# Patient Record
Sex: Female | Born: 1970 | Race: White | Hispanic: No | Marital: Married | Smoking: Never smoker
Health system: Southern US, Community
[De-identification: ages and names within clinical notes are randomized; demographics above are authoritative.]

## PROBLEM LIST (undated history)

## (undated) DIAGNOSIS — F410 Panic disorder [episodic paroxysmal anxiety] without agoraphobia: Secondary | ICD-10-CM

## (undated) DIAGNOSIS — F419 Anxiety disorder, unspecified: Secondary | ICD-10-CM

## (undated) DIAGNOSIS — Z8489 Family history of other specified conditions: Secondary | ICD-10-CM

## (undated) DIAGNOSIS — F329 Major depressive disorder, single episode, unspecified: Secondary | ICD-10-CM

## (undated) DIAGNOSIS — E039 Hypothyroidism, unspecified: Secondary | ICD-10-CM

## (undated) DIAGNOSIS — K219 Gastro-esophageal reflux disease without esophagitis: Secondary | ICD-10-CM

## (undated) DIAGNOSIS — F32A Depression, unspecified: Secondary | ICD-10-CM

## (undated) DIAGNOSIS — R0600 Dyspnea, unspecified: Secondary | ICD-10-CM

## (undated) DIAGNOSIS — M549 Dorsalgia, unspecified: Secondary | ICD-10-CM

## (undated) HISTORY — PX: EYE SURGERY: SHX253

## (undated) HISTORY — PX: ABDOMINAL HYSTERECTOMY: SHX81

---

## 1997-07-14 ENCOUNTER — Inpatient Hospital Stay (HOSPITAL_COMMUNITY): Admission: AD | Admit: 1997-07-14 | Discharge: 1997-07-14 | Payer: Self-pay | Admitting: Obstetrics and Gynecology

## 1997-11-15 ENCOUNTER — Ambulatory Visit (HOSPITAL_COMMUNITY): Admission: RE | Admit: 1997-11-15 | Discharge: 1997-11-15 | Payer: Self-pay | Admitting: Obstetrics and Gynecology

## 1997-12-21 ENCOUNTER — Observation Stay (HOSPITAL_COMMUNITY): Admission: AD | Admit: 1997-12-21 | Discharge: 1997-12-21 | Payer: Self-pay | Admitting: Obstetrics and Gynecology

## 1998-01-12 ENCOUNTER — Inpatient Hospital Stay (HOSPITAL_COMMUNITY): Admission: AD | Admit: 1998-01-12 | Discharge: 1998-01-12 | Payer: Self-pay | Admitting: Obstetrics and Gynecology

## 1998-02-01 ENCOUNTER — Inpatient Hospital Stay (HOSPITAL_COMMUNITY): Admission: AD | Admit: 1998-02-01 | Discharge: 1998-02-03 | Payer: Self-pay | Admitting: Obstetrics and Gynecology

## 1998-06-30 ENCOUNTER — Other Ambulatory Visit: Admission: RE | Admit: 1998-06-30 | Discharge: 1998-06-30 | Payer: Self-pay | Admitting: Obstetrics and Gynecology

## 1999-06-12 ENCOUNTER — Other Ambulatory Visit: Admission: RE | Admit: 1999-06-12 | Discharge: 1999-06-12 | Payer: Self-pay | Admitting: Obstetrics and Gynecology

## 2000-05-23 ENCOUNTER — Other Ambulatory Visit: Admission: RE | Admit: 2000-05-23 | Discharge: 2000-05-23 | Payer: Self-pay | Admitting: Obstetrics and Gynecology

## 2001-05-26 ENCOUNTER — Other Ambulatory Visit: Admission: RE | Admit: 2001-05-26 | Discharge: 2001-05-26 | Payer: Self-pay | Admitting: Obstetrics and Gynecology

## 2003-05-31 ENCOUNTER — Other Ambulatory Visit: Admission: RE | Admit: 2003-05-31 | Discharge: 2003-05-31 | Payer: Self-pay | Admitting: Obstetrics and Gynecology

## 2004-06-15 ENCOUNTER — Other Ambulatory Visit: Admission: RE | Admit: 2004-06-15 | Discharge: 2004-06-15 | Payer: Self-pay | Admitting: Obstetrics and Gynecology

## 2005-07-06 ENCOUNTER — Other Ambulatory Visit: Admission: RE | Admit: 2005-07-06 | Discharge: 2005-07-06 | Payer: Self-pay | Admitting: Obstetrics and Gynecology

## 2010-04-20 ENCOUNTER — Other Ambulatory Visit: Payer: Self-pay | Admitting: Obstetrics and Gynecology

## 2010-04-20 ENCOUNTER — Ambulatory Visit
Admission: RE | Admit: 2010-04-20 | Discharge: 2010-04-20 | Disposition: A | Discharge status: Home / Self Care | Payer: Self-pay | Source: Ambulatory Visit | Attending: Obstetrics and Gynecology | Admitting: Obstetrics and Gynecology

## 2010-04-20 DIAGNOSIS — Z1239 Encounter for other screening for malignant neoplasm of breast: Secondary | ICD-10-CM

## 2011-06-07 ENCOUNTER — Other Ambulatory Visit: Payer: Self-pay | Admitting: Obstetrics and Gynecology

## 2011-06-07 DIAGNOSIS — Z1231 Encounter for screening mammogram for malignant neoplasm of breast: Secondary | ICD-10-CM

## 2011-08-03 ENCOUNTER — Ambulatory Visit
Admission: RE | Admit: 2011-08-03 | Discharge: 2011-08-03 | Disposition: A | Discharge status: Home / Self Care | Payer: Self-pay | Source: Ambulatory Visit | Attending: Obstetrics and Gynecology | Admitting: Obstetrics and Gynecology

## 2011-08-03 DIAGNOSIS — Z1231 Encounter for screening mammogram for malignant neoplasm of breast: Secondary | ICD-10-CM

## 2012-08-18 ENCOUNTER — Other Ambulatory Visit: Payer: Self-pay

## 2012-08-18 DIAGNOSIS — Z1231 Encounter for screening mammogram for malignant neoplasm of breast: Secondary | ICD-10-CM

## 2012-08-19 ENCOUNTER — Ambulatory Visit: Payer: Self-pay

## 2014-08-17 ENCOUNTER — Encounter (HOSPITAL_COMMUNITY): Payer: Self-pay | Admitting: *Deleted

## 2014-08-17 ENCOUNTER — Other Ambulatory Visit: Payer: Self-pay | Admitting: Neurosurgery

## 2014-08-18 ENCOUNTER — Encounter (HOSPITAL_COMMUNITY)
Admission: RE | Disposition: A | Discharge status: Home / Self Care | Payer: Self-pay | Source: Ambulatory Visit | Attending: Neurosurgery

## 2014-08-18 ENCOUNTER — Ambulatory Visit (HOSPITAL_COMMUNITY): Payer: PRIVATE HEALTH INSURANCE | Admitting: Anesthesiology

## 2014-08-18 ENCOUNTER — Encounter (HOSPITAL_COMMUNITY): Payer: Self-pay | Admitting: *Deleted

## 2014-08-18 ENCOUNTER — Ambulatory Visit (HOSPITAL_COMMUNITY): Payer: PRIVATE HEALTH INSURANCE

## 2014-08-18 ENCOUNTER — Ambulatory Visit (HOSPITAL_COMMUNITY)
Admission: RE | Admit: 2014-08-18 | Discharge: 2014-08-19 | Disposition: A | Discharge status: Home / Self Care | Payer: PRIVATE HEALTH INSURANCE | Source: Ambulatory Visit | Attending: Neurosurgery | Admitting: Neurosurgery

## 2014-08-18 DIAGNOSIS — M5127 Other intervertebral disc displacement, lumbosacral region: Secondary | ICD-10-CM | POA: Insufficient documentation

## 2014-08-18 DIAGNOSIS — M1990 Unspecified osteoarthritis, unspecified site: Secondary | ICD-10-CM | POA: Diagnosis not present

## 2014-08-18 DIAGNOSIS — F419 Anxiety disorder, unspecified: Secondary | ICD-10-CM | POA: Insufficient documentation

## 2014-08-18 DIAGNOSIS — Z881 Allergy status to other antibiotic agents status: Secondary | ICD-10-CM | POA: Insufficient documentation

## 2014-08-18 DIAGNOSIS — K219 Gastro-esophageal reflux disease without esophagitis: Secondary | ICD-10-CM | POA: Diagnosis not present

## 2014-08-18 DIAGNOSIS — Z880 Allergy status to penicillin: Secondary | ICD-10-CM | POA: Insufficient documentation

## 2014-08-18 DIAGNOSIS — Z886 Allergy status to analgesic agent status: Secondary | ICD-10-CM | POA: Diagnosis not present

## 2014-08-18 DIAGNOSIS — M5126 Other intervertebral disc displacement, lumbar region: Secondary | ICD-10-CM | POA: Diagnosis present

## 2014-08-18 DIAGNOSIS — Z419 Encounter for procedure for purposes other than remedying health state, unspecified: Secondary | ICD-10-CM

## 2014-08-18 HISTORY — DX: Gastro-esophageal reflux disease without esophagitis: K21.9

## 2014-08-18 HISTORY — DX: Family history of other specified conditions: Z84.89

## 2014-08-18 HISTORY — DX: Dorsalgia, unspecified: M54.9

## 2014-08-18 HISTORY — DX: Anxiety disorder, unspecified: F41.9

## 2014-08-18 HISTORY — PX: LUMBAR LAMINECTOMY/DECOMPRESSION MICRODISCECTOMY: SHX5026

## 2014-08-18 LAB — CBC
HCT: 39.5 % (ref 36.0–46.0)
HEMOGLOBIN: 13.4 g/dL (ref 12.0–15.0)
MCH: 29.7 pg (ref 26.0–34.0)
MCHC: 33.9 g/dL (ref 30.0–36.0)
MCV: 87.6 fL (ref 78.0–100.0)
PLATELETS: 164 10*3/uL (ref 150–400)
RBC: 4.51 MIL/uL (ref 3.87–5.11)
RDW: 13.4 % (ref 11.5–15.5)
WBC: 6.8 10*3/uL (ref 4.0–10.5)

## 2014-08-18 LAB — HCG, SERUM, QUALITATIVE: Preg, Serum: NEGATIVE

## 2014-08-18 LAB — SURGICAL PCR SCREEN
MRSA, PCR: POSITIVE — AB
Staphylococcus aureus: POSITIVE — AB

## 2014-08-18 SURGERY — LUMBAR LAMINECTOMY/DECOMPRESSION MICRODISCECTOMY 1 LEVEL
Anesthesia: General | Site: Spine Lumbar | Laterality: Bilateral

## 2014-08-18 MED ORDER — THROMBIN 5000 UNITS EX SOLR
CUTANEOUS | Status: DC | PRN
  Administered 2014-08-18 (×2): 5000 [IU] via TOPICAL

## 2014-08-18 MED ORDER — MEPERIDINE HCL 25 MG/ML IJ SOLN: 6.2500 mg | INTRAMUSCULAR | Status: DC | PRN

## 2014-08-18 MED ORDER — HYDROMORPHONE HCL 1 MG/ML IJ SOLN
INTRAMUSCULAR | Status: AC
  Administered 2014-08-18: 0.5 mg via INTRAVENOUS
  Filled 2014-08-18: qty 1

## 2014-08-18 MED ORDER — PHENOL 1.4 % MT LIQD: 1.0000 | OROMUCOSAL | Status: DC | PRN

## 2014-08-18 MED ORDER — ROCURONIUM BROMIDE 100 MG/10ML IV SOLN
INTRAVENOUS | Status: DC | PRN
  Administered 2014-08-18: 50 mg via INTRAVENOUS

## 2014-08-18 MED ORDER — TRAMADOL HCL 50 MG PO TABS
50.0000 mg | ORAL_TABLET | Freq: Four times a day (QID) | ORAL | Status: DC | PRN
  Administered 2014-08-18: 50 mg via ORAL
  Filled 2014-08-18 (×2): qty 1

## 2014-08-18 MED ORDER — PROPOFOL 10 MG/ML IV BOLUS
INTRAVENOUS | Status: AC
  Filled 2014-08-18: qty 20

## 2014-08-18 MED ORDER — VANCOMYCIN HCL IN DEXTROSE 1-5 GM/200ML-% IV SOLN
INTRAVENOUS | Status: AC
  Filled 2014-08-18: qty 200

## 2014-08-18 MED ORDER — CHLORHEXIDINE GLUCONATE CLOTH 2 % EX PADS
6.0000 | MEDICATED_PAD | Freq: Every day | CUTANEOUS | Status: DC
  Administered 2014-08-19: 6 via TOPICAL

## 2014-08-18 MED ORDER — ONDANSETRON HCL 4 MG/2ML IJ SOLN
INTRAMUSCULAR | Status: DC | PRN
  Administered 2014-08-18: 4 mg via INTRAVENOUS

## 2014-08-18 MED ORDER — LACTATED RINGERS IV SOLN
INTRAVENOUS | Status: DC
  Administered 2014-08-18 (×2): via INTRAVENOUS

## 2014-08-18 MED ORDER — LIDOCAINE HCL (CARDIAC) 20 MG/ML IV SOLN
INTRAVENOUS | Status: DC | PRN
  Administered 2014-08-18: 40 mg via INTRAVENOUS

## 2014-08-18 MED ORDER — MIDAZOLAM HCL 5 MG/5ML IJ SOLN
INTRAMUSCULAR | Status: DC | PRN
  Administered 2014-08-18: 2 mg via INTRAVENOUS

## 2014-08-18 MED ORDER — DIAZEPAM 5 MG PO TABS
5.0000 mg | ORAL_TABLET | Freq: Four times a day (QID) | ORAL | Status: DC | PRN
  Administered 2014-08-18 – 2014-08-19 (×2): 5 mg via ORAL
  Filled 2014-08-18: qty 1

## 2014-08-18 MED ORDER — LIDOCAINE-EPINEPHRINE 0.5 %-1:200000 IJ SOLN
INTRAMUSCULAR | Status: DC | PRN
  Administered 2014-08-18: 10 mL

## 2014-08-18 MED ORDER — SODIUM CHLORIDE 0.9 % IJ SOLN: 3.0000 mL | Freq: Two times a day (BID) | INTRAMUSCULAR | Status: DC

## 2014-08-18 MED ORDER — ACETAMINOPHEN 650 MG RE SUPP
650.0000 mg | RECTAL | Status: DC | PRN
Start: 2014-08-18 — End: 2014-08-19

## 2014-08-18 MED ORDER — DEXAMETHASONE 4 MG PO TABS
4.0000 mg | ORAL_TABLET | Freq: Four times a day (QID) | ORAL | Status: DC
  Administered 2014-08-19 (×2): 4 mg via ORAL
  Filled 2014-08-18 (×3): qty 1

## 2014-08-18 MED ORDER — METHYLPREDNISOLONE ACETATE 80 MG/ML IJ SUSP
INTRAMUSCULAR | Status: DC | PRN
  Administered 2014-08-18: 80 mg

## 2014-08-18 MED ORDER — SODIUM CHLORIDE 0.9 % IV SOLN: 250.0000 mL | INTRAVENOUS | Status: DC

## 2014-08-18 MED ORDER — SODIUM CHLORIDE 0.9 % IJ SOLN: 3.0000 mL | INTRAMUSCULAR | Status: DC | PRN

## 2014-08-18 MED ORDER — MUPIROCIN 2 % EX OINT
1.0000 "application " | TOPICAL_OINTMENT | Freq: Two times a day (BID) | CUTANEOUS | Status: DC
  Administered 2014-08-18: 1 via NASAL

## 2014-08-18 MED ORDER — MUPIROCIN 2 % EX OINT
1.0000 "application " | TOPICAL_OINTMENT | CUTANEOUS | Status: AC
  Administered 2014-08-18: 1 via TOPICAL
  Filled 2014-08-18: qty 22

## 2014-08-18 MED ORDER — ONDANSETRON HCL 4 MG/2ML IJ SOLN: 4.0000 mg | INTRAMUSCULAR | Status: DC | PRN

## 2014-08-18 MED ORDER — HYDROMORPHONE HCL 1 MG/ML IJ SOLN
0.2500 mg | INTRAMUSCULAR | Status: DC | PRN
  Administered 2014-08-18 (×4): 0.5 mg via INTRAVENOUS

## 2014-08-18 MED ORDER — POTASSIUM CHLORIDE IN NACL 20-0.9 MEQ/L-% IV SOLN
INTRAVENOUS | Status: DC
  Filled 2014-08-18 (×3): qty 1000

## 2014-08-18 MED ORDER — PROMETHAZINE HCL 25 MG/ML IJ SOLN: 6.2500 mg | INTRAMUSCULAR | Status: DC | PRN

## 2014-08-18 MED ORDER — PHENYLEPHRINE HCL 10 MG/ML IJ SOLN
INTRAMUSCULAR | Status: DC | PRN
  Administered 2014-08-18 (×2): 40 ug via INTRAVENOUS

## 2014-08-18 MED ORDER — DROSPIREN-ETH ESTRAD-LEVOMEFOL 3-0.02-0.451 MG PO TABS: 1.0000 | ORAL_TABLET | Freq: Every day | ORAL | Status: DC

## 2014-08-18 MED ORDER — FENTANYL CITRATE (PF) 100 MCG/2ML IJ SOLN
INTRAMUSCULAR | Status: DC | PRN
  Administered 2014-08-18 (×4): 50 ug via INTRAVENOUS

## 2014-08-18 MED ORDER — MENTHOL 3 MG MT LOZG: 1.0000 | LOZENGE | OROMUCOSAL | Status: DC | PRN

## 2014-08-18 MED ORDER — PROPOFOL 10 MG/ML IV BOLUS
INTRAVENOUS | Status: DC | PRN
  Administered 2014-08-18: 120 mg via INTRAVENOUS

## 2014-08-18 MED ORDER — NEOSTIGMINE METHYLSULFATE 10 MG/10ML IV SOLN
INTRAVENOUS | Status: DC | PRN
  Administered 2014-08-18: 4 mg via INTRAVENOUS

## 2014-08-18 MED ORDER — DIAZEPAM 5 MG PO TABS
ORAL_TABLET | ORAL | Status: AC
  Filled 2014-08-18: qty 1

## 2014-08-18 MED ORDER — FENTANYL CITRATE (PF) 100 MCG/2ML IJ SOLN
INTRAMUSCULAR | Status: DC | PRN
  Administered 2014-08-18: 100 ug via INTRAVENOUS

## 2014-08-18 MED ORDER — MIDAZOLAM HCL 2 MG/2ML IJ SOLN
INTRAMUSCULAR | Status: AC
  Filled 2014-08-18: qty 2

## 2014-08-18 MED ORDER — GLYCOPYRROLATE 0.2 MG/ML IJ SOLN
INTRAMUSCULAR | Status: DC | PRN
  Administered 2014-08-18: 0.6 mg via INTRAVENOUS

## 2014-08-18 MED ORDER — CITALOPRAM HYDROBROMIDE 20 MG PO TABS
20.0000 mg | ORAL_TABLET | Freq: Every day | ORAL | Status: DC
  Filled 2014-08-18: qty 1

## 2014-08-18 MED ORDER — DEXAMETHASONE SODIUM PHOSPHATE 4 MG/ML IJ SOLN
4.0000 mg | Freq: Four times a day (QID) | INTRAMUSCULAR | Status: DC
  Filled 2014-08-18: qty 1

## 2014-08-18 MED ORDER — ACETAMINOPHEN 325 MG PO TABS: 650.0000 mg | ORAL_TABLET | ORAL | Status: DC | PRN

## 2014-08-18 MED ORDER — 0.9 % SODIUM CHLORIDE (POUR BTL) OPTIME
TOPICAL | Status: DC | PRN
  Administered 2014-08-18: 1000 mL

## 2014-08-18 MED ORDER — FENTANYL CITRATE (PF) 250 MCG/5ML IJ SOLN
INTRAMUSCULAR | Status: AC
  Filled 2014-08-18: qty 5

## 2014-08-18 MED ORDER — VANCOMYCIN HCL 1000 MG IV SOLR
1000.0000 mg | Freq: Two times a day (BID) | INTRAVENOUS | Status: DC
  Administered 2014-08-18 (×2): 1000 mg via INTRAVENOUS
  Filled 2014-08-18 (×4): qty 1000

## 2014-08-18 MED ORDER — ONDANSETRON HCL 4 MG/2ML IJ SOLN
INTRAMUSCULAR | Status: AC
Start: 2014-08-18 — End: 2014-08-18
  Filled 2014-08-18: qty 2

## 2014-08-18 MED ORDER — SENNA 8.6 MG PO TABS
1.0000 | ORAL_TABLET | Freq: Two times a day (BID) | ORAL | Status: DC
  Administered 2014-08-18: 8.6 mg via ORAL
  Filled 2014-08-18 (×3): qty 1

## 2014-08-18 MED ORDER — FENTANYL CITRATE (PF) 100 MCG/2ML IJ SOLN
INTRAMUSCULAR | Status: AC
  Filled 2014-08-18: qty 2

## 2014-08-18 MED ORDER — CALCIUM CARBONATE 1250 (500 CA) MG PO CHEW: 1250.0000 mg | CHEWABLE_TABLET | Freq: Every day | ORAL | Status: DC | PRN

## 2014-08-18 MED ORDER — NEOSTIGMINE METHYLSULFATE 10 MG/10ML IV SOLN
INTRAVENOUS | Status: AC
  Filled 2014-08-18: qty 1

## 2014-08-18 MED ORDER — GLYCOPYRROLATE 0.2 MG/ML IJ SOLN
INTRAMUSCULAR | Status: AC
  Filled 2014-08-18: qty 3

## 2014-08-18 MED ORDER — HEMOSTATIC AGENTS (NO CHARGE) OPTIME
TOPICAL | Status: DC | PRN
  Administered 2014-08-18: 1 via TOPICAL

## 2014-08-18 SURGICAL SUPPLY — 58 items
APL SKNCLS STERI-STRIP NONHPOA (GAUZE/BANDAGES/DRESSINGS)
BAG DECANTER FOR FLEXI CONT (MISCELLANEOUS) ×3 IMPLANT
BENZOIN TINCTURE PRP APPL 2/3 (GAUZE/BANDAGES/DRESSINGS) IMPLANT
BLADE CLIPPER SURG (BLADE) IMPLANT
BUR MATCHSTICK NEURO 3.0 LAGG (BURR) ×3 IMPLANT
CANISTER SUCT 3000ML PPV (MISCELLANEOUS) ×3 IMPLANT
CLOSURE WOUND 1/2 X4 (GAUZE/BANDAGES/DRESSINGS)
CONT SPEC 4OZ CLIKSEAL STRL BL (MISCELLANEOUS) ×3 IMPLANT
DECANTER SPIKE VIAL GLASS SM (MISCELLANEOUS) ×3 IMPLANT
DRAPE LAPAROTOMY 100X72X124 (DRAPES) ×3 IMPLANT
DRAPE MICROSCOPE LEICA (MISCELLANEOUS) ×3 IMPLANT
DRAPE POUCH INSTRU U-SHP 10X18 (DRAPES) ×3 IMPLANT
DRAPE SURG 17X23 STRL (DRAPES) ×3 IMPLANT
DURAPREP 26ML APPLICATOR (WOUND CARE) ×3 IMPLANT
ELECT REM PT RETURN 9FT ADLT (ELECTROSURGICAL) ×3
ELECTRODE REM PT RTRN 9FT ADLT (ELECTROSURGICAL) ×1 IMPLANT
GAUZE SPONGE 4X4 12PLY STRL (GAUZE/BANDAGES/DRESSINGS) IMPLANT
GAUZE SPONGE 4X4 16PLY XRAY LF (GAUZE/BANDAGES/DRESSINGS) IMPLANT
GLOVE BIO SURGEON STRL SZ 6.5 (GLOVE) ×2 IMPLANT
GLOVE BIO SURGEONS STRL SZ 6.5 (GLOVE) ×2
GLOVE BIOGEL PI IND STRL 7.0 (GLOVE) IMPLANT
GLOVE BIOGEL PI IND STRL 7.5 (GLOVE) IMPLANT
GLOVE BIOGEL PI IND STRL 8 (GLOVE) IMPLANT
GLOVE BIOGEL PI INDICATOR 7.0 (GLOVE) ×4
GLOVE BIOGEL PI INDICATOR 7.5 (GLOVE) ×2
GLOVE BIOGEL PI INDICATOR 8 (GLOVE) ×6
GLOVE ECLIPSE 6.5 STRL STRAW (GLOVE) ×3 IMPLANT
GLOVE ECLIPSE 7.5 STRL STRAW (GLOVE) ×6 IMPLANT
GLOVE SURG SS PI 7.0 STRL IVOR (GLOVE) ×4 IMPLANT
GOWN STRL REUS W/ TWL LRG LVL3 (GOWN DISPOSABLE) ×2 IMPLANT
GOWN STRL REUS W/ TWL XL LVL3 (GOWN DISPOSABLE) IMPLANT
GOWN STRL REUS W/TWL 2XL LVL3 (GOWN DISPOSABLE) IMPLANT
GOWN STRL REUS W/TWL LRG LVL3 (GOWN DISPOSABLE) ×3
GOWN STRL REUS W/TWL XL LVL3 (GOWN DISPOSABLE) ×9
KIT BASIN OR (CUSTOM PROCEDURE TRAY) ×3 IMPLANT
KIT ROOM TURNOVER OR (KITS) ×3 IMPLANT
LIQUID BAND (GAUZE/BANDAGES/DRESSINGS) ×3 IMPLANT
NDL HYPO 18GX1.5 BLUNT FILL (NEEDLE) IMPLANT
NDL HYPO 25X1 1.5 SAFETY (NEEDLE) ×1 IMPLANT
NDL SPNL 18GX3.5 QUINCKE PK (NEEDLE) IMPLANT
NEEDLE HYPO 18GX1.5 BLUNT FILL (NEEDLE) ×3 IMPLANT
NEEDLE HYPO 25X1 1.5 SAFETY (NEEDLE) ×3 IMPLANT
NEEDLE SPNL 18GX3.5 QUINCKE PK (NEEDLE) ×3 IMPLANT
NS IRRIG 1000ML POUR BTL (IV SOLUTION) ×3 IMPLANT
PACK LAMINECTOMY NEURO (CUSTOM PROCEDURE TRAY) ×3 IMPLANT
PAD ARMBOARD 7.5X6 YLW CONV (MISCELLANEOUS) ×9 IMPLANT
RUBBERBAND STERILE (MISCELLANEOUS) ×6 IMPLANT
SPONGE LAP 4X18 X RAY DECT (DISPOSABLE) IMPLANT
SPONGE SURGIFOAM ABS GEL SZ50 (HEMOSTASIS) ×3 IMPLANT
STRIP CLOSURE SKIN 1/2X4 (GAUZE/BANDAGES/DRESSINGS) IMPLANT
SUT VIC AB 0 CT1 18XCR BRD8 (SUTURE) ×1 IMPLANT
SUT VIC AB 0 CT1 8-18 (SUTURE) ×3
SUT VIC AB 2-0 CT1 18 (SUTURE) ×3 IMPLANT
SUT VIC AB 3-0 SH 8-18 (SUTURE) ×3 IMPLANT
SYR 20ML ECCENTRIC (SYRINGE) ×3 IMPLANT
TOWEL OR 17X24 6PK STRL BLUE (TOWEL DISPOSABLE) ×3 IMPLANT
TOWEL OR 17X26 10 PK STRL BLUE (TOWEL DISPOSABLE) ×3 IMPLANT
WATER STERILE IRR 1000ML POUR (IV SOLUTION) ×3 IMPLANT

## 2014-08-18 NOTE — Op Note (Signed)
08/18/2014  8:21 PM  PATIENT:  Stacy Villanueva  44 y.o. female with severe pain in the back and left lower extremity  PRE-OPERATIVE DIAGNOSIS:  lumbar herniated disc L5/S1  POST-OPERATIVE DIAGNOSIS:  lumbar herniated disc L5/S1  PROCEDURE:  Procedure(s):Left LUMBAR FIVE-SACRAL ONE LUMBAR LAMINECTOMY/DECOMPRESSION MICRODISCECTOMY 1 LEVEL  SURGEON:   Surgeon(s): Ashok Pall, MD Jovita Gamma, MD  ASSISTANTS:nudelman, Herbie [place]  ANESTHESIA:   general  EBL:  Total I/O In: 600 [I.V.:600] Out: 20 [Blood:50]  BLOOD ADMINISTERED:none  CELL SAVER GIVEN:none  COUNT:per nursing  DRAINS: none   SPECIMEN:  No Specimen  DICTATION: Ms. Selden was taken to the operating room, intubated and placed under a general anesthetic without difficulty. She was positioned prone on a Wilson frame with all pressure points padded. Her back was prepped and draped in a sterile manner. I opened the skin with a 10 blade and carried the dissection down to the thoracolumbar fascia. I used both sharp dissection and the monopolar cautery to expose the lamina of L5, and S1. I confirmed my location with an intraoperative xray.  I used  Kerrison punches, and curettes to perform a semihemilaminectomy of L5. I used the punches to remove the ligamentum flavum exposing the thecal sac. I brought the microscope into the operative field and with Dr.Nudelman's assistance we started our decompression of the spinal canal, thecal sac and S1 root(s). I cauterized epidural veins overlying the disc space then divided them sharply. I opened the disc space with a 15 blade and proceeded with the discectomy. I used pituitary rongeurs, curettes, and other instruments to remove disc material. After the discectomy was completed We inspected the S1 nerve root and felt it was well decompressed. I explored rostrally, laterally, medially, and caudally and was satisfied with the decompression. I irrigated the wound, then closed in layers. I  approximated the thoracolumbar fascia, subcutaneous, and subcuticular planes with vicryl sutures. I used dermabond for a sterile dressing.   PLAN OF CARE: Admit for overnight observation  PATIENT DISPOSITION:  PACU - hemodynamically stable.   Delay start of Pharmacological VTE agent (>24hrs) due to surgical blood loss or risk of bleeding:  yes

## 2014-08-18 NOTE — H&P (Signed)
Stacy Villanueva is an 44 y.o. female.   Chief Complaint: left lower extremity pain HPI: hnp L5/S1. Has not responded to conservative treatment, now decided to proceed with operative decompression. Pain has increased last 2 months.   Past Medical History  Diagnosis Date  . Anxiety   . GERD (gastroesophageal reflux disease)   . Arthritis   . Back pain   . Family history of adverse reaction to anesthesia     daughter is slow to wake up, slight nausea    Past Surgical History  Procedure Laterality Date  . No past surgeries      Family History  Problem Relation Age of Onset  . Hypertension Mother   . Hypertension Father    Social History:  reports that she has never smoked. She has never used smokeless tobacco. She reports that she does not drink alcohol or use illicit drugs.  Allergies:  Allergies  Allergen Reactions  . Codeine Hives and Nausea Only  . Hydrocodone Other (See Comments)    Intolerance - pt states med keeps her awake for 18-20 hours and makes her very irate  . Meloxicam Other (See Comments)    Severe headache  . Penicillins Rash    Medications Prior to Admission  Medication Sig Dispense Refill  . acetaminophen (TYLENOL) 500 MG tablet Take 1,000 mg by mouth every 6 (six) hours as needed for mild pain or headache.     . calcium carbonate (OS-CAL) 1250 (500 CA) MG chewable tablet Chew 1 tablet by mouth daily as needed for heartburn.    . citalopram (CELEXA) 20 MG tablet Take 20 mg by mouth daily.    . Drospirenone-Ethinyl Estradiol-Levomefol (BEYAZ) 3-0.02-0.451 MG tablet Take 1 tablet by mouth daily.      Results for orders placed or performed during the hospital encounter of 08/18/14 (from the past 48 hour(s))  CBC     Status: None   Collection Time: 08/18/14 12:32 PM  Result Value Ref Range   WBC 6.8 4.0 - 10.5 K/uL   RBC 4.51 3.87 - 5.11 MIL/uL   Hemoglobin 13.4 12.0 - 15.0 g/dL   HCT 39.5 36.0 - 46.0 %   MCV 87.6 78.0 - 100.0 fL   MCH 29.7 26.0 - 34.0  pg   MCHC 33.9 30.0 - 36.0 g/dL   RDW 13.4 11.5 - 15.5 %   Platelets 164 150 - 400 K/uL  hCG, serum, qualitative     Status: None   Collection Time: 08/18/14 12:32 PM  Result Value Ref Range   Preg, Serum NEGATIVE NEGATIVE    Comment:        THE SENSITIVITY OF THIS METHODOLOGY IS >10 mIU/mL.   Surgical pcr screen     Status: Abnormal   Collection Time: 08/18/14 12:45 PM  Result Value Ref Range   MRSA, PCR POSITIVE (A) NEGATIVE   Staphylococcus aureus POSITIVE (A) NEGATIVE    Comment:        The Xpert SA Assay (FDA approved for NASAL specimens in patients over 38 years of age), is one component of a comprehensive surveillance program.  Test performance has been validated by [facility] for patients greater than or equal to 42 year old. It is not intended to diagnose infection nor to guide or monitor treatment.    No results found.  Review of Systems  Constitutional: Negative.   HENT: Negative.   Eyes: Negative.   Respiratory: Negative.   Cardiovascular: Negative.   Gastrointestinal: Negative.   Genitourinary: Negative.  Musculoskeletal: Positive for back pain.  Skin: Negative.   Neurological:       Left lower extremity pain  Endo/Heme/Allergies: Negative.   Psychiatric/Behavioral: Negative.     Blood pressure 126/76, pulse 77, temperature 98.8 F (37.1 C), temperature source Oral, resp. rate 18, height 5\' 6"  (1.676 m), weight 72.576 kg (160 lb), last menstrual period 08/03/2014, SpO2 100 %. Physical Exam  Constitutional: She is oriented to person, place, and time. She appears well-developed and well-nourished. She appears distressed.  HENT:  Head: Normocephalic and atraumatic.  Eyes: Conjunctivae and EOM are normal. Pupils are equal, round, and reactive to light.  Neck: Normal range of motion. Neck supple.  Cardiovascular: Normal rate, regular rhythm and normal heart sounds.   Respiratory: Effort normal and breath sounds normal.  GI: Soft. Bowel sounds are  normal.  Neurological: She is alert and oriented to person, place, and time. She has normal reflexes. She displays normal reflexes. No cranial nerve deficit. She exhibits normal muscle tone. Coordination normal.  Skin: Skin is warm and dry.  Psychiatric: She has a normal mood and affect. Her behavior is normal. Judgment and thought content normal.     Assessment/Plan BP 126/76 mmHg  Pulse 77  Temp(Src) 98.8 F (37.1 C) (Oral)  Resp 18  Ht 5\' 6"  (1.676 m)  Wt 72.576 kg (160 lb)  BMI 25.84 kg/m2  SpO2 100%  LMP 08/03/2014 Stacy Villanueva has decided to undergo a lumbar discetomy/decompression for pain  at levels L5/S1. Risks and benefits including but not limited to bleeding, infection, paralysis, weakness in one or both extremities, bowel and/or bladder dysfunction, need for further surgery, no relief of pain. Stacy Villanueva understands and wishes to proceed.  Stacy Villanueva L 08/18/2014, 5:18 PM

## 2014-08-18 NOTE — Anesthesia Preprocedure Evaluation (Signed)
Anesthesia Evaluation  Patient identified by MRN, date of birth, ID band Patient awake    Reviewed: Allergy & Precautions, NPO status , Patient's Chart, lab work & pertinent test results  Airway Mallampati: II  TM Distance: >3 FB Neck ROM: Full    Dental no notable dental hx.    Pulmonary neg pulmonary ROS,  breath sounds clear to auscultation  Pulmonary exam normal       Cardiovascular negative cardio ROS Normal cardiovascular examRhythm:Regular Rate:Normal     Neuro/Psych PSYCHIATRIC DISORDERS Anxiety negative neurological ROS     GI/Hepatic Neg liver ROS, GERD-  Medicated,  Endo/Other  negative endocrine ROS  Renal/GU negative Renal ROS     Musculoskeletal  (+) Arthritis -,   Abdominal   Peds  Hematology negative hematology ROS (+)   Anesthesia Other Findings   Reproductive/Obstetrics negative OB ROS                             Anesthesia Physical Anesthesia Plan  ASA: II  Anesthesia Plan: General   Post-op Pain Management:    Induction: Intravenous  Airway Management Planned: Oral ETT  Additional Equipment:   Intra-op Plan:   Post-operative Plan: Extubation in OR  Informed Consent: I have reviewed the patients History and Physical, chart, labs and discussed the procedure including the risks, benefits and alternatives for the proposed anesthesia with the patient or authorized representative who has indicated his/her understanding and acceptance.   Dental advisory given  Plan Discussed with: CRNA  Anesthesia Plan Comments:         Anesthesia Quick Evaluation

## 2014-08-18 NOTE — Anesthesia Postprocedure Evaluation (Signed)
  Anesthesia Post-op Note  Patient: Stacy Villanueva  Procedure(s) Performed: Procedure(s) with comments: LUMBAR FIVE-SACRAL ONE LUMBAR LAMINECTOMY/DECOMPRESSION MICRODISCECTOMY 1 LEVEL (Bilateral) - Bilat L5S1 microdiskectomy  Patient Location: PACU  Anesthesia Type:General  Level of Consciousness: awake and alert   Airway and Oxygen Therapy: Patient Spontanous Breathing  Post-op Pain: mild  Post-op Assessment: Post-op Vital signs reviewed, Patient's Cardiovascular Status Stable and Respiratory Function Stable  Post-op Vital Signs: Reviewed  Filed Vitals:   08/18/14 2033  BP: 125/67  Pulse: 84  Temp:   Resp: 13    Complications: No apparent anesthesia complications

## 2014-08-18 NOTE — Anesthesia Procedure Notes (Signed)
Procedure Name: Intubation Date/Time: 08/18/2014 6:31 PM Performed by: Merdis Delay Pre-anesthesia Checklist: Patient identified, Timeout performed, Emergency Drugs available, Suction available and Patient being monitored Patient Re-evaluated:Patient Re-evaluated prior to inductionOxygen Delivery Method: Circle system utilized Preoxygenation: Pre-oxygenation with 100% oxygen Intubation Type: IV induction Ventilation: Mask ventilation without difficulty Laryngoscope Size: Mac and 3 Grade View: Grade I Tube type: Oral Tube size: 7.5 mm Number of attempts: 1 Airway Equipment and Method: Stylet Placement Confirmation: ETT inserted through vocal cords under direct vision,  breath sounds checked- equal and bilateral,  positive ETCO2 and CO2 detector Secured at: 23 cm Tube secured with: Tape Dental Injury: Teeth and Oropharynx as per pre-operative assessment

## 2014-08-18 NOTE — Transfer of Care (Signed)
Immediate Anesthesia Transfer of Care Note  Patient: Stacy Villanueva  Procedure(s) Performed: Procedure(s) with comments: LUMBAR FIVE-SACRAL ONE LUMBAR LAMINECTOMY/DECOMPRESSION MICRODISCECTOMY 1 LEVEL (Bilateral) - Bilat L5S1 microdiskectomy  Patient Location: PACU  Anesthesia Type:General  Level of Consciousness: awake, alert  and oriented  Airway & Oxygen Therapy: Patient connected to nasal cannula oxygen  Post-op Assessment: Report given to RN, Post -op Vital signs reviewed and stable and Patient moving all extremities X 4  Post vital signs: Reviewed and stable  Last Vitals:  Filed Vitals:   08/18/14 1959  BP:   Pulse:   Temp: 36.7 C  Resp:     Complications: No apparent anesthesia complications

## 2014-08-19 ENCOUNTER — Encounter (HOSPITAL_COMMUNITY): Payer: Self-pay | Admitting: Neurosurgery

## 2014-08-19 DIAGNOSIS — M5127 Other intervertebral disc displacement, lumbosacral region: Secondary | ICD-10-CM | POA: Diagnosis not present

## 2014-08-19 MED ORDER — TRAMADOL HCL 50 MG PO TABS
50.0000 mg | ORAL_TABLET | ORAL | Status: DC | PRN
  Administered 2014-08-19 (×2): 50 mg via ORAL
  Filled 2014-08-19: qty 1

## 2014-08-19 MED ORDER — CYCLOBENZAPRINE HCL 10 MG PO TABS: 10.0000 mg | ORAL_TABLET | Freq: Three times a day (TID) | ORAL | Status: DC | PRN

## 2014-08-19 MED ORDER — TRAMADOL HCL 50 MG PO TABS: 50.0000 mg | ORAL_TABLET | Freq: Four times a day (QID) | ORAL | Status: DC | PRN

## 2014-08-19 NOTE — Discharge Summary (Signed)
  Physician Discharge Summary  Patient ID: Stacy Villanueva MRN: [id number] DOB/AGE: 04-06-1970 44 y.o.  Admit date: 08/18/2014 Discharge date: 08/19/2014  Admission Diagnoses:Hnp L5/s1, left S1 radiculopathy  Discharge Diagnoses:  Active Problems:   HNP (herniated nucleus pulposus), lumbar   Discharged Condition: good  Hospital Course: Stacy Villanueva was admitted and taken to the operating room for an uncomplicated lumbar laminectomy and discetomy. She is voiding, ambulating, and tolerating a regular diet at discharge. Her wound is clean, dry, and without signs of infection at discharge.   Treatments: surgery: LUMBAR FIVE-SACRAL ONE LUMBAR LAMINECTOMY/DECOMPRESSION MICRODISCECTOMY 1 LEVEL  Discharge Exam: Blood pressure 112/64, pulse 70, temperature 98 F (36.7 C), temperature source Oral, resp. rate 18, height 5\' 6"  (1.676 m), weight 72.576 kg (160 lb), last menstrual period 08/03/2014, SpO2 99 %. General appearance: alert, cooperative, appears stated age and no distress Neurologic: Alert and oriented X 3, normal strength and tone. Normal symmetric reflexes. Normal coordination and gait  Disposition:  lumbar herniated disc    Medication List    TAKE these medications        acetaminophen 500 MG tablet  Commonly known as:  TYLENOL  Take 1,000 mg by mouth every 6 (six) hours as needed for mild pain or headache.     calcium carbonate 1250 (500 CA) MG chewable tablet  Commonly known as:  OS-CAL  Chew 1 tablet by mouth daily as needed for heartburn.     citalopram 20 MG tablet  Commonly known as:  CELEXA  Take 20 mg by mouth daily.     cyclobenzaprine 10 MG tablet  Commonly known as:  FLEXERIL  Take 1 tablet (10 mg total) by mouth 3 (three) times daily as needed for muscle spasms.     Drospirenone-Ethinyl Estradiol-Levomefol 3-0.02-0.451 MG tablet  Commonly known as:  BEYAZ  Take 1 tablet by mouth daily.     traMADol 50 MG tablet  Commonly known as:  ULTRAM  Take 1 tablet  (50 mg total) by mouth every 6 (six) hours as needed.           Follow-up Information    Follow up with Mercades Bajaj L, MD In 4 weeks.   Specialty:  Neurosurgery   Why:  call office to make an appointment   Contact information:   [address] [place] [address] [phone]       Signed: Winfield Cunas 08/19/2014, 9:03 AM

## 2014-08-19 NOTE — Progress Notes (Signed)
Pt doing well. Pt and husband given D/C instructions with Rx's, verbal understanding was provided. Pt's incision is clean and dry with no sign of infection. Pt's IV was removed prior to D/C. Pt D/C'd home via walking @ 0940 per MD order. Pt is stable @ D/C and has no other needs at this time. Larkin Ina

## 2014-08-19 NOTE — Discharge Instructions (Signed)
Lumbar Discectomy °Care After °A discectomy involves removal of discmaterial (the cartilage-like structures located between the bones of the back). It is done to relieve pressure on nerve roots. It can be used as a treatment for a back problem. The time in surgery depends on the findings in surgery and what is necessary to correct the problems. °HOME CARE INSTRUCTIONS  °· Check the cut (incision) made by the surgeon twice a day for signs of infection. Some signs of infection may include:  °· A foul smelling, greenish or yellowish discharge from the wound.  °· Increased pain.  °· Increased redness over the incision (operative) site.  °· The skin edges may separate.  °· Flu-like symptoms (problems).  °· A temperature above 101.5° F (38.6° C).  °· Change your bandages in about 24 to 36 hours following surgery or as directed.  °· You may shower tomrrow.  Avoid bathtubs, swimming pools and hot tubs for three weeks or until your incision has healed completely. °· Follow your doctor's instructions as to safe activities, exercises, and physical therapy.  °· Weight reduction may be beneficial if you are overweight.  °· Daily exercise is helpful to prevent the return of problems. Walking is permitted. You may use a treadmill without an incline. Cut down on activities and exercise if you have discomfort. You may also go up and down stairs as much as you can tolerate.  °· DO NOT lift anything heavier than 10 to 15 lbs. Avoid bending or twisting at the waist. Always bend your knees when lifting.  °· Maintain strength and range of motion as instructed.  °· Do not drive for 10 days, or as directed by your doctors. You may be a passenger . Lying back in the passenger seat may be more comfortable for you. Always wear a seatbelt.  °· Limit your sitting in a regular chair to 20 to 30 minutes at a time. There are no limitations for sitting in a recliner. You should lie down or walk in between sitting periods.  °· Only take  over-the-counter or prescription medicines for pain, discomfort, or fever as directed by your caregiver.  °SEEK MEDICAL CARE IF:  °· There is increased bleeding (more than a small spot) from the wound.  °· You notice redness, swelling, or increasing pain in the wound.  °· Pus is coming from wound.  °· You develop an unexplained oral temperature above 102° F (38.9° C) develops.  °· You notice a foul smell coming from the wound or dressing.  °· You have increasing pain in your wound.  °SEEK IMMEDIATE MEDICAL CARE IF:  °· You develop a rash.  °· You have difficulty breathing.  °· You develop any allergic problems to medicines given.  °Document Released: 02/08/2004 Document Revised: 02/22/2011 Document Reviewed: 05/29/2007 °ExitCare® Patient Information °

## 2014-10-11 ENCOUNTER — Other Ambulatory Visit: Payer: Self-pay | Admitting: Neurosurgery

## 2014-10-11 DIAGNOSIS — M5127 Other intervertebral disc displacement, lumbosacral region: Secondary | ICD-10-CM

## 2014-10-14 ENCOUNTER — Ambulatory Visit
Admission: RE | Admit: 2014-10-14 | Discharge: 2014-10-14 | Disposition: A | Discharge status: Home / Self Care | Payer: PRIVATE HEALTH INSURANCE | Source: Ambulatory Visit | Attending: Neurosurgery | Admitting: Neurosurgery

## 2014-10-14 VITALS — BP 107/57 | HR 72

## 2014-10-14 DIAGNOSIS — M5127 Other intervertebral disc displacement, lumbosacral region: Secondary | ICD-10-CM

## 2014-10-14 DIAGNOSIS — M5126 Other intervertebral disc displacement, lumbar region: Secondary | ICD-10-CM

## 2014-10-14 MED ORDER — ONDANSETRON HCL 4 MG/2ML IJ SOLN: 4.0000 mg | Freq: Four times a day (QID) | INTRAMUSCULAR | Status: DC | PRN

## 2014-10-14 MED ORDER — MEPERIDINE HCL 100 MG/ML IJ SOLN
75.0000 mg | Freq: Once | INTRAMUSCULAR | Status: AC
  Administered 2014-10-14: 75 mg via INTRAMUSCULAR

## 2014-10-14 MED ORDER — ONDANSETRON HCL 4 MG/2ML IJ SOLN
4.0000 mg | Freq: Once | INTRAMUSCULAR | Status: AC
  Administered 2014-10-14: 4 mg via INTRAMUSCULAR

## 2014-10-14 MED ORDER — DIAZEPAM 5 MG PO TABS
10.0000 mg | ORAL_TABLET | Freq: Once | ORAL | Status: AC
  Administered 2014-10-14: 10 mg via ORAL

## 2014-10-14 MED ORDER — IOHEXOL 180 MG/ML  SOLN
15.0000 mL | Freq: Once | INTRAMUSCULAR | Status: AC | PRN
  Administered 2014-10-14: 15 mL via INTRATHECAL

## 2014-10-14 NOTE — Discharge Instructions (Signed)
Myelogram Discharge Instructions  1. Go home and rest quietly for the next 24 hours.  It is important to lie flat for the next 24 hours.  Get up only to go to the restroom.  You may lie in the bed or on a couch on your back, your stomach, your left side or your right side.  You may have one pillow under your head.  You may have pillows between your knees while you are on your side or under your knees while you are on your back.  2. DO NOT drive today.  Recline the seat as far back as it will go, while still wearing your seat belt, on the way home.  3. You may get up to go to the bathroom as needed.  You may sit up for 10 minutes to eat.  You may resume your normal diet and medications unless otherwise indicated.  Drink lots of extra fluids today and tomorrow.  4. The incidence of headache, nausea, or vomiting is about 5% (one in 20 patients).  If you develop a headache, lie flat and drink plenty of fluids until the headache goes away.  Caffeinated beverages may be helpful.  If you develop severe nausea and vomiting or a headache that does not go away with flat bed rest, call [phone].  5. You may resume normal activities after your 24 hours of bed rest is over; however, do not exert yourself strongly or do any heavy lifting tomorrow. If when you get up you have a headache when standing, go back to bed and force fluids for another 24 hours.  6. Call your physician for a follow-up appointment.  The results of your myelogram will be sent directly to your physician by the following day.  7. If you have any questions or if complications develop after you arrive home, please call [phone].  Discharge instructions have been explained to the patient.  The patient, or the person responsible for the patient, fully understands these instructions.       May resume Citalopram on October 15, 2014, after 1:00 pm.

## 2014-10-14 NOTE — Progress Notes (Signed)
Pt has been off Citalopram for the past 2 days.

## 2014-10-18 ENCOUNTER — Other Ambulatory Visit: Payer: Self-pay | Admitting: Neurosurgery

## 2014-10-19 ENCOUNTER — Encounter (HOSPITAL_COMMUNITY): Payer: Self-pay | Admitting: *Deleted

## 2014-10-20 ENCOUNTER — Ambulatory Visit (HOSPITAL_COMMUNITY): Payer: 59 | Admitting: Certified Registered Nurse Anesthetist

## 2014-10-20 ENCOUNTER — Encounter (HOSPITAL_COMMUNITY)
Admission: RE | Disposition: A | Discharge status: Home / Self Care | Payer: Self-pay | Source: Ambulatory Visit | Attending: Neurosurgery

## 2014-10-20 ENCOUNTER — Ambulatory Visit (HOSPITAL_COMMUNITY)
Admission: RE | Admit: 2014-10-20 | Discharge: 2014-10-21 | Disposition: A | Discharge status: Home / Self Care | Payer: 59 | Source: Ambulatory Visit | Attending: Neurosurgery | Admitting: Neurosurgery

## 2014-10-20 ENCOUNTER — Ambulatory Visit (HOSPITAL_COMMUNITY): Payer: 59

## 2014-10-20 ENCOUNTER — Encounter (HOSPITAL_COMMUNITY): Payer: Self-pay | Admitting: *Deleted

## 2014-10-20 DIAGNOSIS — Z880 Allergy status to penicillin: Secondary | ICD-10-CM | POA: Insufficient documentation

## 2014-10-20 DIAGNOSIS — M1990 Unspecified osteoarthritis, unspecified site: Secondary | ICD-10-CM | POA: Insufficient documentation

## 2014-10-20 DIAGNOSIS — M5127 Other intervertebral disc displacement, lumbosacral region: Secondary | ICD-10-CM | POA: Diagnosis present

## 2014-10-20 DIAGNOSIS — Z888 Allergy status to other drugs, medicaments and biological substances status: Secondary | ICD-10-CM | POA: Insufficient documentation

## 2014-10-20 DIAGNOSIS — Z419 Encounter for procedure for purposes other than remedying health state, unspecified: Secondary | ICD-10-CM

## 2014-10-20 DIAGNOSIS — Z79899 Other long term (current) drug therapy: Secondary | ICD-10-CM | POA: Insufficient documentation

## 2014-10-20 DIAGNOSIS — F419 Anxiety disorder, unspecified: Secondary | ICD-10-CM | POA: Insufficient documentation

## 2014-10-20 DIAGNOSIS — M5126 Other intervertebral disc displacement, lumbar region: Secondary | ICD-10-CM | POA: Diagnosis present

## 2014-10-20 HISTORY — PX: LUMBAR LAMINECTOMY/DECOMPRESSION MICRODISCECTOMY: SHX5026

## 2014-10-20 LAB — BASIC METABOLIC PANEL
ANION GAP: 10 (ref 5–15)
BUN: 12 mg/dL (ref 6–20)
CHLORIDE: 106 mmol/L (ref 101–111)
CO2: 22 mmol/L (ref 22–32)
Calcium: 8.6 mg/dL — ABNORMAL LOW (ref 8.9–10.3)
Creatinine, Ser: 0.68 mg/dL (ref 0.44–1.00)
GFR calc non Af Amer: >60 mL/min (ref >60)
Glucose, Bld: 92 mg/dL (ref 65–99)
Potassium: 4.1 mmol/L (ref 3.5–5.1)
SODIUM: 138 mmol/L (ref 135–145)

## 2014-10-20 LAB — HCG, SERUM, QUALITATIVE: PREG SERUM: NEGATIVE

## 2014-10-20 LAB — CBC
HCT: 34.7 % — ABNORMAL LOW (ref 36.0–46.0)
Hemoglobin: 11.9 g/dL — ABNORMAL LOW (ref 12.0–15.0)
MCH: 30 pg (ref 26.0–34.0)
MCHC: 34.3 g/dL (ref 30.0–36.0)
MCV: 87.4 fL (ref 78.0–100.0)
PLATELETS: 155 10*3/uL (ref 150–400)
RBC: 3.97 MIL/uL (ref 3.87–5.11)
RDW: 12.8 % (ref 11.5–15.5)
WBC: 6 10*3/uL (ref 4.0–10.5)

## 2014-10-20 LAB — SURGICAL PCR SCREEN
MRSA, PCR: NEGATIVE
STAPHYLOCOCCUS AUREUS: NEGATIVE

## 2014-10-20 SURGERY — LUMBAR LAMINECTOMY/DECOMPRESSION MICRODISCECTOMY 1 LEVEL
Anesthesia: General | Laterality: Bilateral

## 2014-10-20 MED ORDER — GLYCOPYRROLATE 0.2 MG/ML IJ SOLN
INTRAMUSCULAR | Status: AC
  Filled 2014-10-20: qty 3

## 2014-10-20 MED ORDER — DEXAMETHASONE SODIUM PHOSPHATE 10 MG/ML IJ SOLN
INTRAMUSCULAR | Status: DC | PRN
  Administered 2014-10-20: 10 mg via INTRAVENOUS

## 2014-10-20 MED ORDER — METHYLPREDNISOLONE ACETATE 80 MG/ML IJ SUSP
INTRAMUSCULAR | Status: DC | PRN
  Administered 2014-10-20: 80 mg

## 2014-10-20 MED ORDER — FENTANYL CITRATE (PF) 100 MCG/2ML IJ SOLN
INTRAMUSCULAR | Status: AC
  Filled 2014-10-20: qty 2

## 2014-10-20 MED ORDER — DIAZEPAM 5 MG PO TABS
5.0000 mg | ORAL_TABLET | Freq: Four times a day (QID) | ORAL | Status: DC | PRN
  Administered 2014-10-20: 5 mg via ORAL
  Filled 2014-10-20: qty 1

## 2014-10-20 MED ORDER — OXYCODONE HCL 5 MG/5ML PO SOLN: 5.0000 mg | Freq: Once | ORAL | Status: AC | PRN

## 2014-10-20 MED ORDER — MIDAZOLAM HCL 5 MG/5ML IJ SOLN
INTRAMUSCULAR | Status: DC | PRN
  Administered 2014-10-20: 2 mg via INTRAVENOUS

## 2014-10-20 MED ORDER — LACTATED RINGERS IV SOLN
INTRAVENOUS | Status: DC
  Administered 2014-10-20: 08:00:00 via INTRAVENOUS

## 2014-10-20 MED ORDER — CITALOPRAM HYDROBROMIDE 20 MG PO TABS
20.0000 mg | ORAL_TABLET | Freq: Every day | ORAL | Status: DC
  Administered 2014-10-20: 20 mg via ORAL
  Filled 2014-10-20 (×2): qty 1

## 2014-10-20 MED ORDER — FENTANYL CITRATE (PF) 100 MCG/2ML IJ SOLN
INTRAMUSCULAR | Status: DC | PRN
  Administered 2014-10-20: 25 ug via INTRAVENOUS

## 2014-10-20 MED ORDER — VANCOMYCIN HCL IN DEXTROSE 1-5 GM/200ML-% IV SOLN
1000.0000 mg | INTRAVENOUS | Status: AC
  Administered 2014-10-20: 1000 mg via INTRAVENOUS
  Filled 2014-10-20: qty 200

## 2014-10-20 MED ORDER — KETOROLAC TROMETHAMINE 30 MG/ML IJ SOLN
INTRAMUSCULAR | Status: AC
  Filled 2014-10-20: qty 1

## 2014-10-20 MED ORDER — MAGNESIUM CITRATE PO SOLN
1.0000 | Freq: Once | ORAL | Status: AC | PRN
  Filled 2014-10-20: qty 296

## 2014-10-20 MED ORDER — GLYCOPYRROLATE 0.2 MG/ML IJ SOLN
INTRAMUSCULAR | Status: DC | PRN
  Administered 2014-10-20: 0.6 mg via INTRAVENOUS

## 2014-10-20 MED ORDER — CYCLOBENZAPRINE HCL 10 MG PO TABS
ORAL_TABLET | ORAL | Status: AC
  Filled 2014-10-20: qty 1

## 2014-10-20 MED ORDER — TRAMADOL HCL 50 MG PO TABS
50.0000 mg | ORAL_TABLET | Freq: Four times a day (QID) | ORAL | Status: DC | PRN
  Administered 2014-10-20: 50 mg via ORAL
  Filled 2014-10-20: qty 1

## 2014-10-20 MED ORDER — FENTANYL CITRATE (PF) 100 MCG/2ML IJ SOLN
INTRAMUSCULAR | Status: DC | PRN
  Administered 2014-10-20: 125 ug via INTRAVENOUS
  Administered 2014-10-20: 50 ug via INTRAVENOUS
  Administered 2014-10-20: 75 ug via INTRAVENOUS

## 2014-10-20 MED ORDER — LACTATED RINGERS IV SOLN
INTRAVENOUS | Status: DC | PRN
  Administered 2014-10-20 (×2): via INTRAVENOUS

## 2014-10-20 MED ORDER — PHENYLEPHRINE 40 MCG/ML (10ML) SYRINGE FOR IV PUSH (FOR BLOOD PRESSURE SUPPORT)
PREFILLED_SYRINGE | INTRAVENOUS | Status: AC
  Filled 2014-10-20: qty 10

## 2014-10-20 MED ORDER — HEMOSTATIC AGENTS (NO CHARGE) OPTIME
TOPICAL | Status: DC | PRN
  Administered 2014-10-20: 1 via TOPICAL

## 2014-10-20 MED ORDER — NEOSTIGMINE METHYLSULFATE 10 MG/10ML IV SOLN
INTRAVENOUS | Status: AC
  Filled 2014-10-20: qty 1

## 2014-10-20 MED ORDER — EPHEDRINE SULFATE 50 MG/ML IJ SOLN
INTRAMUSCULAR | Status: AC
  Filled 2014-10-20: qty 1

## 2014-10-20 MED ORDER — ROCURONIUM BROMIDE 50 MG/5ML IV SOLN
INTRAVENOUS | Status: AC
  Filled 2014-10-20: qty 1

## 2014-10-20 MED ORDER — ZOLPIDEM TARTRATE 5 MG PO TABS: 5.0000 mg | ORAL_TABLET | Freq: Every evening | ORAL | Status: DC | PRN

## 2014-10-20 MED ORDER — SUCCINYLCHOLINE CHLORIDE 20 MG/ML IJ SOLN
INTRAMUSCULAR | Status: AC
  Filled 2014-10-20: qty 1

## 2014-10-20 MED ORDER — DOCUSATE SODIUM 100 MG PO CAPS
100.0000 mg | ORAL_CAPSULE | Freq: Two times a day (BID) | ORAL | Status: DC
  Administered 2014-10-20: 100 mg via ORAL
  Filled 2014-10-20: qty 1

## 2014-10-20 MED ORDER — DROSPIREN-ETH ESTRAD-LEVOMEFOL 3-0.02-0.451 MG PO TABS: 1.0000 | ORAL_TABLET | Freq: Every day | ORAL | Status: DC

## 2014-10-20 MED ORDER — 0.9 % SODIUM CHLORIDE (POUR BTL) OPTIME
TOPICAL | Status: DC | PRN
  Administered 2014-10-20: 1000 mL

## 2014-10-20 MED ORDER — ACETAMINOPHEN 650 MG RE SUPP
650.0000 mg | RECTAL | Status: DC | PRN
  Filled 2014-10-20: qty 1

## 2014-10-20 MED ORDER — POTASSIUM CHLORIDE IN NACL 20-0.9 MEQ/L-% IV SOLN
INTRAVENOUS | Status: DC
  Filled 2014-10-20 (×3): qty 1000

## 2014-10-20 MED ORDER — DEXAMETHASONE SODIUM PHOSPHATE 10 MG/ML IJ SOLN
INTRAMUSCULAR | Status: AC
  Filled 2014-10-20: qty 1

## 2014-10-20 MED ORDER — KETOROLAC TROMETHAMINE 30 MG/ML IJ SOLN
30.0000 mg | Freq: Four times a day (QID) | INTRAMUSCULAR | Status: DC
  Administered 2014-10-20 – 2014-10-21 (×4): 30 mg via INTRAVENOUS
  Filled 2014-10-20 (×7): qty 1

## 2014-10-20 MED ORDER — CYCLOBENZAPRINE HCL 10 MG PO TABS
10.0000 mg | ORAL_TABLET | Freq: Three times a day (TID) | ORAL | Status: DC | PRN
  Administered 2014-10-20 – 2014-10-21 (×2): 10 mg via ORAL
  Filled 2014-10-20 (×3): qty 1

## 2014-10-20 MED ORDER — MUPIROCIN 2 % EX OINT
1.0000 "application " | TOPICAL_OINTMENT | Freq: Once | CUTANEOUS | Status: AC
  Administered 2014-10-20: 1 via TOPICAL
  Filled 2014-10-20: qty 22

## 2014-10-20 MED ORDER — FENTANYL CITRATE (PF) 250 MCG/5ML IJ SOLN
INTRAMUSCULAR | Status: AC
  Filled 2014-10-20: qty 5

## 2014-10-20 MED ORDER — BISACODYL 5 MG PO TBEC
5.0000 mg | DELAYED_RELEASE_TABLET | Freq: Every day | ORAL | Status: DC | PRN
  Filled 2014-10-20: qty 1

## 2014-10-20 MED ORDER — PROPOFOL 10 MG/ML IV BOLUS
INTRAVENOUS | Status: AC
  Filled 2014-10-20: qty 20

## 2014-10-20 MED ORDER — LIDOCAINE HCL (CARDIAC) 20 MG/ML IV SOLN
INTRAVENOUS | Status: AC
  Filled 2014-10-20: qty 5

## 2014-10-20 MED ORDER — PHENOL 1.4 % MT LIQD: 1.0000 | OROMUCOSAL | Status: DC | PRN

## 2014-10-20 MED ORDER — MENTHOL 3 MG MT LOZG: 1.0000 | LOZENGE | OROMUCOSAL | Status: DC | PRN

## 2014-10-20 MED ORDER — ROCURONIUM BROMIDE 100 MG/10ML IV SOLN
INTRAVENOUS | Status: DC | PRN
  Administered 2014-10-20: 40 mg via INTRAVENOUS
  Administered 2014-10-20: 10 mg via INTRAVENOUS

## 2014-10-20 MED ORDER — MIDAZOLAM HCL 2 MG/2ML IJ SOLN
INTRAMUSCULAR | Status: AC
  Filled 2014-10-20: qty 4

## 2014-10-20 MED ORDER — LIDOCAINE HCL (CARDIAC) 20 MG/ML IV SOLN
INTRAVENOUS | Status: DC | PRN
  Administered 2014-10-20: 60 mg via INTRAVENOUS

## 2014-10-20 MED ORDER — SODIUM CHLORIDE 0.9 % IJ SOLN: 3.0000 mL | Freq: Two times a day (BID) | INTRAMUSCULAR | Status: DC

## 2014-10-20 MED ORDER — SODIUM CHLORIDE 0.9 % IJ SOLN: 3.0000 mL | INTRAMUSCULAR | Status: DC | PRN

## 2014-10-20 MED ORDER — SENNOSIDES-DOCUSATE SODIUM 8.6-50 MG PO TABS: 1.0000 | ORAL_TABLET | Freq: Every evening | ORAL | Status: DC | PRN

## 2014-10-20 MED ORDER — ARTIFICIAL TEARS OP OINT
TOPICAL_OINTMENT | OPHTHALMIC | Status: DC | PRN
  Administered 2014-10-20: 1 via OPHTHALMIC

## 2014-10-20 MED ORDER — OXYCODONE HCL 5 MG PO TABS
ORAL_TABLET | ORAL | Status: AC
  Filled 2014-10-20: qty 2

## 2014-10-20 MED ORDER — SODIUM CHLORIDE 0.9 % IV SOLN: 250.0000 mL | INTRAVENOUS | Status: DC

## 2014-10-20 MED ORDER — GABAPENTIN 300 MG PO CAPS
300.0000 mg | ORAL_CAPSULE | Freq: Three times a day (TID) | ORAL | Status: DC
  Administered 2014-10-20 (×2): 300 mg via ORAL
  Filled 2014-10-20 (×5): qty 1

## 2014-10-20 MED ORDER — ONDANSETRON HCL 4 MG/2ML IJ SOLN: 4.0000 mg | INTRAMUSCULAR | Status: DC | PRN

## 2014-10-20 MED ORDER — HYDROMORPHONE HCL 1 MG/ML IJ SOLN: 0.5000 mg | INTRAMUSCULAR | Status: DC | PRN

## 2014-10-20 MED ORDER — PROMETHAZINE HCL 25 MG/ML IJ SOLN: 6.2500 mg | INTRAMUSCULAR | Status: DC | PRN

## 2014-10-20 MED ORDER — OXYCODONE HCL 5 MG PO TABS
10.0000 mg | ORAL_TABLET | Freq: Once | ORAL | Status: AC | PRN
  Administered 2014-10-20: 10 mg via ORAL

## 2014-10-20 MED ORDER — ONDANSETRON HCL 4 MG/2ML IJ SOLN
INTRAMUSCULAR | Status: AC
  Filled 2014-10-20: qty 2

## 2014-10-20 MED ORDER — ARTIFICIAL TEARS OP OINT
TOPICAL_OINTMENT | OPHTHALMIC | Status: AC
  Filled 2014-10-20: qty 3.5

## 2014-10-20 MED ORDER — SODIUM CHLORIDE 0.9 % IJ SOLN
INTRAMUSCULAR | Status: AC
  Filled 2014-10-20: qty 10

## 2014-10-20 MED ORDER — PROPOFOL 10 MG/ML IV BOLUS
INTRAVENOUS | Status: DC | PRN
  Administered 2014-10-20: 130 mg via INTRAVENOUS

## 2014-10-20 MED ORDER — THROMBIN 5000 UNITS EX SOLR
CUTANEOUS | Status: DC | PRN
  Administered 2014-10-20 (×2): 5000 [IU] via TOPICAL

## 2014-10-20 MED ORDER — ONDANSETRON HCL 4 MG/2ML IJ SOLN
INTRAMUSCULAR | Status: DC | PRN
  Administered 2014-10-20: 4 mg via INTRAVENOUS

## 2014-10-20 MED ORDER — ACETAMINOPHEN 325 MG PO TABS: 650.0000 mg | ORAL_TABLET | ORAL | Status: DC | PRN

## 2014-10-20 MED ORDER — NEOSTIGMINE METHYLSULFATE 10 MG/10ML IV SOLN
INTRAVENOUS | Status: DC | PRN
  Administered 2014-10-20: 4 mg via INTRAVENOUS

## 2014-10-20 MED ORDER — LIDOCAINE-EPINEPHRINE 0.5 %-1:200000 IJ SOLN
INTRAMUSCULAR | Status: DC | PRN
  Administered 2014-10-20: 35 mL
  Administered 2014-10-20: 5 mL

## 2014-10-20 MED ORDER — PHENYLEPHRINE HCL 10 MG/ML IJ SOLN
INTRAMUSCULAR | Status: DC | PRN
  Administered 2014-10-20 (×2): 80 ug via INTRAVENOUS
  Administered 2014-10-20: 40 ug via INTRAVENOUS
  Administered 2014-10-20: 80 ug via INTRAVENOUS

## 2014-10-20 MED ORDER — FENTANYL CITRATE (PF) 100 MCG/2ML IJ SOLN
25.0000 ug | INTRAMUSCULAR | Status: DC | PRN
  Administered 2014-10-20 (×3): 50 ug via INTRAVENOUS

## 2014-10-20 MED ORDER — HYDROMORPHONE HCL 2 MG PO TABS
2.0000 mg | ORAL_TABLET | ORAL | Status: DC | PRN
  Administered 2014-10-20 – 2014-10-21 (×2): 2 mg via ORAL
  Filled 2014-10-20 (×2): qty 1

## 2014-10-20 SURGICAL SUPPLY — 54 items
APL SKNCLS STERI-STRIP NONHPOA (GAUZE/BANDAGES/DRESSINGS)
BAG DECANTER FOR FLEXI CONT (MISCELLANEOUS) ×1 IMPLANT
BENZOIN TINCTURE PRP APPL 2/3 (GAUZE/BANDAGES/DRESSINGS) IMPLANT
BLADE CLIPPER SURG (BLADE) IMPLANT
BUR MATCHSTICK NEURO 3.0 LAGG (BURR) ×3 IMPLANT
CANISTER SUCT 3000ML PPV (MISCELLANEOUS) ×3 IMPLANT
CLOSURE WOUND 1/2 X4 (GAUZE/BANDAGES/DRESSINGS)
CONT SPEC 4OZ CLIKSEAL STRL BL (MISCELLANEOUS) ×1 IMPLANT
DECANTER SPIKE VIAL GLASS SM (MISCELLANEOUS) ×3 IMPLANT
DRAPE LAPAROTOMY 100X72X124 (DRAPES) ×3 IMPLANT
DRAPE MICROSCOPE LEICA (MISCELLANEOUS) ×3 IMPLANT
DRAPE POUCH INSTRU U-SHP 10X18 (DRAPES) ×3 IMPLANT
DRAPE SURG 17X23 STRL (DRAPES) ×3 IMPLANT
DRSG OPSITE POSTOP 4X6 (GAUZE/BANDAGES/DRESSINGS) ×2 IMPLANT
DURAPREP 26ML APPLICATOR (WOUND CARE) ×3 IMPLANT
ELECT REM PT RETURN 9FT ADLT (ELECTROSURGICAL) ×3
ELECTRODE REM PT RTRN 9FT ADLT (ELECTROSURGICAL) ×1 IMPLANT
GAUZE SPONGE 4X4 12PLY STRL (GAUZE/BANDAGES/DRESSINGS) IMPLANT
GAUZE SPONGE 4X4 16PLY XRAY LF (GAUZE/BANDAGES/DRESSINGS) IMPLANT
GLOVE ECLIPSE 6.5 STRL STRAW (GLOVE) ×3 IMPLANT
GLOVE EXAM NITRILE LRG STRL (GLOVE) IMPLANT
GLOVE EXAM NITRILE MD LF STRL (GLOVE) IMPLANT
GLOVE EXAM NITRILE XL STR (GLOVE) IMPLANT
GLOVE EXAM NITRILE XS STR PU (GLOVE) IMPLANT
GOWN STRL REUS W/ TWL LRG LVL3 (GOWN DISPOSABLE) ×2 IMPLANT
GOWN STRL REUS W/ TWL XL LVL3 (GOWN DISPOSABLE) IMPLANT
GOWN STRL REUS W/TWL 2XL LVL3 (GOWN DISPOSABLE) IMPLANT
GOWN STRL REUS W/TWL LRG LVL3 (GOWN DISPOSABLE) ×6
GOWN STRL REUS W/TWL XL LVL3 (GOWN DISPOSABLE)
KIT BASIN OR (CUSTOM PROCEDURE TRAY) ×3 IMPLANT
KIT ROOM TURNOVER OR (KITS) ×3 IMPLANT
LIQUID BAND (GAUZE/BANDAGES/DRESSINGS) ×3 IMPLANT
NDL HYPO 18GX1.5 BLUNT FILL (NEEDLE) IMPLANT
NDL HYPO 25X1 1.5 SAFETY (NEEDLE) ×1 IMPLANT
NDL SPNL 18GX3.5 QUINCKE PK (NEEDLE) IMPLANT
NEEDLE HYPO 18GX1.5 BLUNT FILL (NEEDLE) ×3 IMPLANT
NEEDLE HYPO 25X1 1.5 SAFETY (NEEDLE) ×3 IMPLANT
NEEDLE SPNL 18GX3.5 QUINCKE PK (NEEDLE) IMPLANT
NS IRRIG 1000ML POUR BTL (IV SOLUTION) ×3 IMPLANT
PACK LAMINECTOMY NEURO (CUSTOM PROCEDURE TRAY) ×3 IMPLANT
PAD ARMBOARD 7.5X6 YLW CONV (MISCELLANEOUS) ×9 IMPLANT
RUBBERBAND STERILE (MISCELLANEOUS) ×6 IMPLANT
SPONGE LAP 4X18 X RAY DECT (DISPOSABLE) IMPLANT
SPONGE SURGIFOAM ABS GEL SZ50 (HEMOSTASIS) ×3 IMPLANT
STRIP CLOSURE SKIN 1/2X4 (GAUZE/BANDAGES/DRESSINGS) IMPLANT
SUT VIC AB 0 CT1 18XCR BRD8 (SUTURE) ×1 IMPLANT
SUT VIC AB 0 CT1 8-18 (SUTURE) ×3
SUT VIC AB 2-0 CT1 18 (SUTURE) ×3 IMPLANT
SUT VIC AB 3-0 SH 8-18 (SUTURE) ×5 IMPLANT
SYR 20ML ECCENTRIC (SYRINGE) ×1 IMPLANT
SYR 3ML LL SCALE MARK (SYRINGE) ×2 IMPLANT
TOWEL OR 17X24 6PK STRL BLUE (TOWEL DISPOSABLE) ×3 IMPLANT
TOWEL OR 17X26 10 PK STRL BLUE (TOWEL DISPOSABLE) ×3 IMPLANT
WATER STERILE IRR 1000ML POUR (IV SOLUTION) ×3 IMPLANT

## 2014-10-20 NOTE — Anesthesia Preprocedure Evaluation (Signed)
Anesthesia Evaluation  Patient identified by MRN, date of birth, ID band Patient awake    Reviewed: Allergy & Precautions, NPO status , Patient's Chart, lab work & pertinent test results  Airway Mallampati: II  TM Distance: >3 FB Neck ROM: Full    Dental no notable dental hx.    Pulmonary neg pulmonary ROS,  breath sounds clear to auscultation  Pulmonary exam normal       Cardiovascular negative cardio ROS Normal cardiovascular examRhythm:Regular Rate:Normal     Neuro/Psych PSYCHIATRIC DISORDERS Anxiety negative neurological ROS     GI/Hepatic Neg liver ROS, GERD-  Medicated,  Endo/Other  negative endocrine ROS  Renal/GU negative Renal ROS     Musculoskeletal  (+) Arthritis -,   Abdominal   Peds  Hematology negative hematology ROS (+)   Anesthesia Other Findings   Reproductive/Obstetrics negative OB ROS                             Anesthesia Physical  Anesthesia Plan  ASA: II  Anesthesia Plan: General   Post-op Pain Management:    Induction: Intravenous  Airway Management Planned: Oral ETT  Additional Equipment:   Intra-op Plan:   Post-operative Plan: Extubation in OR  Informed Consent: I have reviewed the patients History and Physical, chart, labs and discussed the procedure including the risks, benefits and alternatives for the proposed anesthesia with the patient or authorized representative who has indicated his/her understanding and acceptance.   Dental advisory given  Plan Discussed with: CRNA, Anesthesiologist and Surgeon  Anesthesia Plan Comments:         Anesthesia Quick Evaluation

## 2014-10-20 NOTE — Addendum Note (Signed)
Addendum  created 10/20/14 1259 by Leonor Liv, CRNA   Modules edited: Anesthesia Medication Administration

## 2014-10-20 NOTE — Progress Notes (Signed)
Transported per [employer], NT 

## 2014-10-20 NOTE — Transfer of Care (Signed)
Immediate Anesthesia Transfer of Care Note  Patient: Stacy Villanueva  Procedure(s) Performed: Procedure(s) with comments: Bilateral Fifth Lumbar Vertebra First Sacral Vertebra Microdiskectomy (Bilateral) - Bilateral L5S1 microdiskectomy  Patient Location: PACU  Anesthesia Type:General  Level of Consciousness: awake, alert  and oriented  Airway & Oxygen Therapy: Patient Spontanous Breathing and Patient connected to nasal cannula oxygen  Post-op Assessment: Report given to RN, Post -op Vital signs reviewed and stable and Patient moving all extremities  Post vital signs: Reviewed and stable  Last Vitals:  Filed Vitals:   10/20/14 1116  BP:   Pulse: 88  Temp:   Resp: 14    Complications: No apparent anesthesia complications

## 2014-10-20 NOTE — Anesthesia Postprocedure Evaluation (Signed)
  Anesthesia Post-op Note  Patient: Stacy Villanueva  Procedure(s) Performed: Procedure(s) (LRB): Bilateral Fifth Lumbar Vertebra First Sacral Vertebra Microdiskectomy (Bilateral)  Patient Location: PACU  Anesthesia Type: General  Level of Consciousness: awake and alert   Airway and Oxygen Therapy: Patient Spontanous Breathing  Post-op Pain: mild  Post-op Assessment: Post-op Vital signs reviewed, Patient's Cardiovascular Status Stable, Respiratory Function Stable, Patent Airway and No signs of Nausea or vomiting  Last Vitals:  Filed Vitals:   10/20/14 1138  BP: 108/61  Pulse: 74  Temp:   Resp: 13    Post-op Vital Signs: stable   Complications: No apparent anesthesia complications

## 2014-10-20 NOTE — Plan of Care (Signed)
Problem: Consults Goal: Diagnosis - Spinal Surgery Outcome: Completed/Met Date Met:  10/20/14 Microdiscectomy

## 2014-10-20 NOTE — H&P (Signed)
Stacy Villanueva is an 44 y.o. female.   Chief Complaint: left lower extremity pain HPI: whom underwent an uncomplicated lumbar discetomy at L5/S1. Post op she has been in tremendous pain. Post op MRI does not show a recurrence, and does show inflammatory changes in the S1 root. Despite anti inflammatories both steroidal and non steroidal she persists with severe pain. I have chosen to explore this region in hopes of finding a mass in the scar or recurrent disc.  Past Medical History  Diagnosis Date  . Anxiety   . Arthritis   . Back pain   . Family history of adverse reaction to anesthesia     daughter is slow to wake up, slight nausea  . GERD (gastroesophageal reflux disease)     10/19/14- not currently    Past Surgical History  Procedure Laterality Date  . Lumbar laminectomy/decompression microdiscectomy Bilateral 08/18/2014    Procedure: LUMBAR FIVE-SACRAL ONE LUMBAR LAMINECTOMY/DECOMPRESSION MICRODISCECTOMY 1 LEVEL;  Surgeon: Ashok Pall, MD;  Location: [facility];  Service: Neurosurgery;  Laterality: Bilateral;  Bilat L5S1 microdiskectomy    Family History  Problem Relation Age of Onset  . Hypertension Mother   . Hypertension Father    Social History:  reports that she has never smoked. She has never used smokeless tobacco. She reports that she does not drink alcohol or use illicit drugs.  Allergies:  Allergies  Allergen Reactions  . Meloxicam Other (See Comments)    Severe headache  . Penicillins Rash    Medications Prior to Admission  Medication Sig Dispense Refill  . citalopram (CELEXA) 20 MG tablet Take 20 mg by mouth daily.    . diazepam (VALIUM) 5 MG tablet Take 5 mg by mouth every 6 (six) hours as needed for muscle spasms.    Mariane Baumgarten Calcium (STOOL SOFTENER PO) Take 2 capsules by mouth 2 (two) times daily.    . Drospirenone-Ethinyl Estradiol-Levomefol (BEYAZ) 3-0.02-0.451 MG tablet Take 1 tablet by mouth daily.    Marland Kitchen gabapentin (NEURONTIN) 300 MG capsule Take 300  mg by mouth 3 (three) times daily.    Marland Kitchen HYDROmorphone (DILAUDID) 2 MG tablet Take 2-4 mg by mouth 4 (four) times daily as needed for severe pain.    Marland Kitchen ibuprofen (ADVIL,MOTRIN) 800 MG tablet Take 800 mg by mouth 3 (three) times daily.    Marland Kitchen oxyCODONE-acetaminophen (PERCOCET/ROXICET) 5-325 MG per tablet Take 1 tablet by mouth every 4 (four) hours as needed for severe pain (takes during the day time).     . cyclobenzaprine (FLEXERIL) 10 MG tablet Take 1 tablet (10 mg total) by mouth 3 (three) times daily as needed for muscle spasms. (Patient not taking: Reported on 10/19/2014) 60 tablet 0  . traMADol (ULTRAM) 50 MG tablet Take 1 tablet (50 mg total) by mouth every 6 (six) hours as needed. (Patient not taking: Reported on 10/19/2014) 70 tablet 0    Results for orders placed or performed during the hospital encounter of 10/20/14 (from the past 48 hour(s))  Basic metabolic panel     Status: Abnormal   Collection Time: 10/20/14  7:45 AM  Result Value Ref Range   Sodium 138 135 - 145 mmol/L   Potassium 4.1 3.5 - 5.1 mmol/L   Chloride 106 101 - 111 mmol/L   CO2 22 22 - 32 mmol/L   Glucose, Bld 92 65 - 99 mg/dL   BUN 12 6 - 20 mg/dL   Creatinine, Ser 0.68 0.44 - 1.00 mg/dL   Calcium 8.6 (L) 8.9 -  10.3 mg/dL   GFR calc non Af Amer >60 >60 mL/min   GFR calc Af Amer >60 >60 mL/min    Comment: (NOTE) The eGFR has been calculated using the CKD EPI equation. This calculation has not been validated in all clinical situations. eGFR's persistently <60 mL/min signify possible Chronic Kidney Disease.    Anion gap 10 5 - 15  CBC     Status: Abnormal   Collection Time: 10/20/14  7:45 AM  Result Value Ref Range   WBC 6.0 4.0 - 10.5 K/uL   RBC 3.97 3.87 - 5.11 MIL/uL   Hemoglobin 11.9 (L) 12.0 - 15.0 g/dL   HCT 34.7 (L) 36.0 - 46.0 %   MCV 87.4 78.0 - 100.0 fL   MCH 30.0 26.0 - 34.0 pg   MCHC 34.3 30.0 - 36.0 g/dL   RDW 12.8 11.5 - 15.5 %   Platelets 155 150 - 400 K/uL  hCG, serum, qualitative     Status:  None   Collection Time: 10/20/14  7:45 AM  Result Value Ref Range   Preg, Serum NEGATIVE NEGATIVE    Comment:        THE SENSITIVITY OF THIS METHODOLOGY IS >10 mIU/mL.    No results found.  Review of Systems  Constitutional: Negative.   HENT: Negative.   Eyes: Negative.   Cardiovascular: Negative.   Gastrointestinal: Negative.   Genitourinary: Negative.   Musculoskeletal: Positive for back pain.  Skin: Negative.   Neurological: Negative.   Endo/Heme/Allergies: Negative.   Psychiatric/Behavioral: Negative.     Blood pressure 110/66, pulse 70, temperature 98.1 F (36.7 C), temperature source Oral, height '5\' 6"'  (1.676 m), weight 82.101 kg (181 lb), last menstrual period 08/18/2014, SpO2 99 %. Physical Exam  Constitutional: She is oriented to person, place, and time. She appears well-developed and well-nourished. She appears distressed.  HENT:  Head: Normocephalic and atraumatic.  Eyes: Conjunctivae and EOM are normal. Pupils are equal, round, and reactive to light.  Neck: Normal range of motion. Neck supple. No JVD present. No tracheal deviation present. No thyromegaly present.  Cardiovascular: Normal rate, regular rhythm and normal heart sounds.   Respiratory: Effort normal and breath sounds normal. No stridor.  GI: Soft. Bowel sounds are normal.  Musculoskeletal: Normal range of motion.  Lymphadenopathy:    She has no cervical adenopathy.  Neurological: She is alert and oriented to person, place, and time. She has normal reflexes. She displays normal reflexes. No cranial nerve deficit. She exhibits normal muscle tone. Coordination normal.  Skin: Skin is warm and dry.  Psychiatric: She has a normal mood and affect. Her behavior is normal. Judgment and thought content normal.     Assessment/Plan Or for redo discetomy.  Roberth Berling L 10/20/2014, 8:56 AM

## 2014-10-20 NOTE — Anesthesia Procedure Notes (Signed)
Procedure Name: Intubation Date/Time: 10/20/2014 9:15 AM Performed by: Trixie Deis A Pre-anesthesia Checklist: Patient identified, Timeout performed, Emergency Drugs available, Suction available and Patient being monitored Patient Re-evaluated:Patient Re-evaluated prior to inductionOxygen Delivery Method: Circle system utilized Preoxygenation: Pre-oxygenation with 100% oxygen Intubation Type: IV induction Ventilation: Mask ventilation without difficulty Laryngoscope Size: Mac and 3 Grade View: Grade I Tube type: Oral Tube size: 7.0 mm Number of attempts: 1 Airway Equipment and Method: Stylet Placement Confirmation: ETT inserted through vocal cords under direct vision,  breath sounds checked- equal and bilateral and positive ETCO2 Secured at: 20 cm Tube secured with: Tape Dental Injury: Teeth and Oropharynx as per pre-operative assessment

## 2014-10-20 NOTE — Op Note (Signed)
10/20/2014  10:59 AM  PATIENT:  Stacy Villanueva  44 y.o. female  PRE-OPERATIVE DIAGNOSIS: recurrent lumbar herniated disc L5/S1  POST-OPERATIVE DIAGNOSIS:  lumbar herniated disc L5/S1  PROCEDURE:  Procedure(s): Right Fifth Lumbar Vertebra First Sacral Vertebra Microdiskectomy  SURGEON:   Surgeon(s): Ashok Pall, MD  ASSISTANTS:none  ANESTHESIA:   general  EBL:  Total I/O In: 1500 [I.V.:1500] Out: -   BLOOD ADMINISTERED:none  CELL SAVER GIVEN:none  COUNT:per nursing  DRAINS: none   SPECIMEN:  No Specimen  DICTATION: Stacy Villanueva was taken to the operating room, intubated and placed under a general anesthetic without difficulty. She was positioned prone on a Wilson frame with all pressure points padded. Her back was prepped and draped in a sterile manner. I opened the skin, at the site of the previous incision, with a 10 blade and carried the dissection down to the thoracolumbar fascia. I used both sharp dissection and the monopolar cautery to expose the lamina of L5, and S1. I confirmed my location with an intraoperative xray.  I used the drill, Kerrison punches, and curettes to perform a semihemilaminectomy of L5 and S1. I used the punches to remove the ligamentum flavum to expose the thecal sac. I brought the microscope into the operative field and started my decompression of the spinal canal, thecal sac and S1 root(s). I cauterized epidural veins overlying the disc space then divided them sharply. I opened the disc space with a 15 blade and proceeded with the discectomy. I used pituitary rongeurs, curettes, and other instruments to remove disc material. I was fairly tenacious in removing scar and disc which in any way could affect the nerve root. After the discectomy was completed I inspected the right S1 nerve root and felt it was well decompressed. I explored rostrally, laterally, medially, and caudally and was satisfied with the decompression. I irrigated the wound, then closed in  layers. I approximated the thoracolumbar fascia, subcutaneous, and subcuticular planes with vicryl sutures. I used dermabond for a sterile dressing.   PLAN OF CARE: Admit for overnight observation  PATIENT DISPOSITION:  PACU - hemodynamically stable.   Delay start of Pharmacological VTE agent (>24hrs) due to surgical blood loss or risk of bleeding:  yes

## 2014-10-21 ENCOUNTER — Encounter (HOSPITAL_COMMUNITY): Payer: Self-pay | Admitting: Neurosurgery

## 2014-10-21 DIAGNOSIS — M5127 Other intervertebral disc displacement, lumbosacral region: Secondary | ICD-10-CM | POA: Diagnosis not present

## 2014-10-21 NOTE — Discharge Instructions (Signed)
Lumbar Discectomy °Care After °A discectomy involves removal of discmaterial (the cartilage-like structures located between the bones of the back). It is done to relieve pressure on nerve roots. It can be used as a treatment for a back problem. The time in surgery depends on the findings in surgery and what is necessary to correct the problems. °HOME CARE INSTRUCTIONS  °· Check the cut (incision) made by the surgeon twice a day for signs of infection. Some signs of infection may include:  °· A foul smelling, greenish or yellowish discharge from the wound.  °· Increased pain.  °· Increased redness over the incision (operative) site.  °· The skin edges may separate.  °· Flu-like symptoms (problems).  °· A temperature above 101.5° F (38.6° C).  °· Change your bandages in about 24 to 36 hours following surgery or as directed.  °· You may shower tomrrow.  Avoid bathtubs, swimming pools and hot tubs for three weeks or until your incision has healed completely. °· Follow your doctor's instructions as to safe activities, exercises, and physical therapy.  °· Weight reduction may be beneficial if you are overweight.  °· Daily exercise is helpful to prevent the return of problems. Walking is permitted. You may use a treadmill without an incline. Cut down on activities and exercise if you have discomfort. You may also go up and down stairs as much as you can tolerate.  °· DO NOT lift anything heavier than 10 to 15 lbs. Avoid bending or twisting at the waist. Always bend your knees when lifting.  °· Maintain strength and range of motion as instructed.  °· Do not drive for 10 days, or as directed by your doctors. You may be a passenger . Lying back in the passenger seat may be more comfortable for you. Always wear a seatbelt.  °· Limit your sitting in a regular chair to 20 to 30 minutes at a time. There are no limitations for sitting in a recliner. You should lie down or walk in between sitting periods.  °· Only take  over-the-counter or prescription medicines for pain, discomfort, or fever as directed by your caregiver.  °SEEK MEDICAL CARE IF:  °· There is increased bleeding (more than a small spot) from the wound.  °· You notice redness, swelling, or increasing pain in the wound.  °· Pus is coming from wound.  °· You develop an unexplained oral temperature above 102° F (38.9° C) develops.  °· You notice a foul smell coming from the wound or dressing.  °· You have increasing pain in your wound.  °SEEK IMMEDIATE MEDICAL CARE IF:  °· You develop a rash.  °· You have difficulty breathing.  °· You develop any allergic problems to medicines given.  °Document Released: 02/08/2004 Document Revised: 02/22/2011 Document Reviewed: 05/29/2007 °ExitCare® Patient Information °

## 2014-10-21 NOTE — Discharge Summary (Signed)
  Physician Discharge Summary  Patient ID: Stacy Villanueva MRN: [id number] DOB/AGE: 27-Apr-1970 44 y.o.  Admit date: 10/20/2014 Discharge date: 10/21/2014  Admission Diagnoses:Recurrent HNP Left L5/S1  Discharge Diagnoses:  Active Problems:   HNP (herniated nucleus pulposus), lumbar   Discharged Condition: good  Hospital Course: Ms. Perdue was admitted and taken to the operating room for an uncomplicated redo lumbar laminectomy at L5/S1 on the left. At discharge her wound is clean, dry, and without signs of infection. She is ambulating, with an antalgic gait. Ms. Mcghee is tolerating a regular diet and voiding without difficulty.   Treatments: surgery: as above  Discharge Exam: Blood pressure 117/69, pulse 70, temperature 98.9 F (37.2 C), temperature source Oral, resp. rate 18, height 5\' 6"  (1.676 m), weight 82.101 kg (181 lb), last menstrual period 08/18/2014, SpO2 99 %. General appearance: alert, cooperative, appears stated age and mild distress Neurologic: Grossly normal  Disposition: 01-Home or Self Care lumbar herniated disc    Medication List    ASK your doctor about these medications        citalopram 20 MG tablet  Commonly known as:  CELEXA  Take 20 mg by mouth daily.     cyclobenzaprine 10 MG tablet  Commonly known as:  FLEXERIL  Take 1 tablet (10 mg total) by mouth 3 (three) times daily as needed for muscle spasms.     diazepam 5 MG tablet  Commonly known as:  VALIUM  Take 5 mg by mouth every 6 (six) hours as needed for muscle spasms.     Drospirenone-Ethinyl Estradiol-Levomefol 3-0.02-0.451 MG tablet  Commonly known as:  BEYAZ  Take 1 tablet by mouth daily.     gabapentin 300 MG capsule  Commonly known as:  NEURONTIN  Take 300 mg by mouth 3 (three) times daily.     HYDROmorphone 2 MG tablet  Commonly known as:  DILAUDID  Take 2-4 mg by mouth 4 (four) times daily as needed for severe pain.     ibuprofen 800 MG tablet  Commonly known as:  ADVIL,MOTRIN   Take 800 mg by mouth 3 (three) times daily.     oxyCODONE-acetaminophen 5-325 MG per tablet  Commonly known as:  PERCOCET/ROXICET  Take 1 tablet by mouth every 4 (four) hours as needed for severe pain (takes during the day time).     STOOL SOFTENER PO  Take 2 capsules by mouth 2 (two) times daily.     traMADol 50 MG tablet  Commonly known as:  ULTRAM  Take 1 tablet (50 mg total) by mouth every 6 (six) hours as needed.           Follow-up Information    Follow up with Meeka Cartelli L, MD In 3 weeks.   Specialty:  Neurosurgery   Why:  call office to make an appointment   Contact information:   [address] [place] [address] [place] [address] [phone]       Signed: Winfield Cunas 10/21/2014, 8:32 AM

## 2014-10-21 NOTE — Progress Notes (Signed)
Pt doing well. Pt given D/C instructions with verbal understanding. Pt's IV was removed prior to D/C. Pt's incision is clean and dry with no sign of infection. Pt D/C'd home via walking @ 0925 per MD order. Pt is stable @ D/C and has no other needs at this time. Holli Humbles, RN

## 2015-03-20 HISTORY — PX: BACK SURGERY: SHX140

## 2015-03-23 ENCOUNTER — Other Ambulatory Visit: Payer: Self-pay | Admitting: Neurosurgery

## 2015-04-01 ENCOUNTER — Other Ambulatory Visit: Payer: Self-pay | Admitting: Neurosurgery

## 2015-04-06 ENCOUNTER — Encounter (HOSPITAL_COMMUNITY)
Admission: RE | Admit: 2015-04-06 | Discharge: 2015-04-06 | Disposition: A | Discharge status: Home / Self Care | Payer: 59 | Source: Ambulatory Visit | Attending: Neurosurgery | Admitting: Neurosurgery

## 2015-04-06 ENCOUNTER — Encounter (HOSPITAL_COMMUNITY): Payer: Self-pay

## 2015-04-06 DIAGNOSIS — Z01812 Encounter for preprocedural laboratory examination: Secondary | ICD-10-CM | POA: Diagnosis not present

## 2015-04-06 DIAGNOSIS — M5126 Other intervertebral disc displacement, lumbar region: Secondary | ICD-10-CM | POA: Diagnosis not present

## 2015-04-06 DIAGNOSIS — Z0183 Encounter for blood typing: Secondary | ICD-10-CM | POA: Insufficient documentation

## 2015-04-06 LAB — CBC
HCT: 39.3 % (ref 36.0–46.0)
Hemoglobin: 12.7 g/dL (ref 12.0–15.0)
MCH: 28.8 pg (ref 26.0–34.0)
MCHC: 32.3 g/dL (ref 30.0–36.0)
MCV: 89.1 fL (ref 78.0–100.0)
PLATELETS: 228 10*3/uL (ref 150–400)
RBC: 4.41 MIL/uL (ref 3.87–5.11)
RDW: 13.2 % (ref 11.5–15.5)
WBC: 9.2 10*3/uL (ref 4.0–10.5)

## 2015-04-06 LAB — HCG, SERUM, QUALITATIVE: Preg, Serum: NEGATIVE

## 2015-04-06 LAB — BASIC METABOLIC PANEL
Anion gap: 10 (ref 5–15)
BUN: 12 mg/dL (ref 6–20)
CO2: 24 mmol/L (ref 22–32)
CREATININE: 0.72 mg/dL (ref 0.44–1.00)
Calcium: 9.2 mg/dL (ref 8.9–10.3)
Chloride: 104 mmol/L (ref 101–111)
GFR calc Af Amer: >60 mL/min (ref >60)
GFR calc non Af Amer: >60 mL/min (ref >60)
Glucose, Bld: 93 mg/dL (ref 65–99)
Potassium: 4.4 mmol/L (ref 3.5–5.1)
Sodium: 138 mmol/L (ref 135–145)

## 2015-04-06 LAB — TYPE AND SCREEN
ABO/RH(D): A POS
Antibody Screen: NEGATIVE

## 2015-04-06 LAB — SURGICAL PCR SCREEN
MRSA, PCR: NEGATIVE
Staphylococcus aureus: NEGATIVE

## 2015-04-06 LAB — ABO/RH: ABO/RH(D): A POS

## 2015-04-06 NOTE — Pre-Procedure Instructions (Signed)
Margarett Trabue  04/06/2015      [facility] - [place], [place] - [address] [address] Phone: [phone] Fax: [phone]    Your procedure is scheduled on Jan 26.  Report to [facility] Admitting at 1030 A.M.  Call this number if you have problems the morning of surgery:  [phone]   Remember:  Do not eat food or drink liquids after midnight.  Take these medicines the morning of surgery with A SIP OF WATER citalopram (Celexa), cyclobenzaprine (Flexeril) if needed, Oxycodone-acetaminophen (Percocet) if needed, Propranolol ER (inderal LA), Yasmin   Do not wear jewelry, make-up or nail polish.  Do not wear lotions, powders, or perfumes.  You may wear deodorant.  Do not shave 48 hours prior to surgery.  Men may shave face and neck.  Do not bring valuables to the hospital.  [facility] is not responsible for any belongings or valuables.  Contacts, dentures or bridgework may not be worn into surgery.  Leave your suitcase in the car.  After surgery it may be brought to your room.  For patients admitted to the hospital, discharge time will be determined by your treatment team.  Patients discharged the day of surgery will not be allowed to drive home.    Special instructions:  [facility] - Preparing for Surgery  Before surgery, you can play an important role.  Because skin is not sterile, your skin needs to be as free of germs as possible.  You can reduce the number of germs on you skin by washing with CHG (chlorahexidine gluconate) soap before surgery.  CHG is an antiseptic cleaner which kills germs and bonds with the skin to continue killing germs even after washing.  Please DO NOT use if you have an allergy to CHG or antibacterial soaps.  If your skin becomes reddened/irritated stop using the CHG and inform your nurse when you arrive at Short Stay.  Do not shave (including legs and underarms) for at least 48  hours prior to the first CHG shower.  You may shave your face.  Please follow these instructions carefully:   1.  Shower with CHG Soap the night before surgery and the   morning of Surgery.  2.  If you choose to wash your hair, wash your hair first as usual with your  normal shampoo.  3.  After you shampoo, rinse your hair and body thoroughly to remove the Shampoo.  4.  Use CHG as you would any other liquid soap.  You can apply chg directly  to the skin and wash gently with scrungie or a clean washcloth.  5.  Apply the CHG Soap to your body ONLY FROM THE NECK DOWN.     Do not use on open wounds or open sores.  Avoid contact with your eyes,   ears, mouth and genitals (private parts).  Wash genitals (private parts)   with your normal soap.  6.  Wash thoroughly, paying special attention to the area where your surgery will be performed.  7.  Thoroughly rinse your body with warm water from the neck down.  8.  DO NOT shower/wash with your normal soap after using and rinsing off  the CHG Soap.  9.  Pat yourself dry with a clean towel.            10.  Wear clean pajamas.            11.  Place clean  sheets on your bed the night of your first shower and do not  sleep with pets.  Day of Surgery  Do not apply any lotions/deoderants the morning of surgery.  Please wear clean clothes to the hospital/surgery center.     Please read over the following fact sheets that you were given. Pain Booklet, Coughing and Deep Breathing, Blood Transfusion Information, MRSA Information and Surgical Site Infection Prevention

## 2015-04-06 NOTE — Progress Notes (Signed)
PCP is Dr Consuello Masse Denies seeing a cardiologist. Denies sever having a stress test, card cath, or echo. Takes Propanolol for anxiety per pt.

## 2015-04-13 MED ORDER — VANCOMYCIN HCL IN DEXTROSE 1-5 GM/200ML-% IV SOLN
1000.0000 mg | INTRAVENOUS | Status: AC
Start: 2015-04-14 — End: 2015-04-14
  Administered 2015-04-14: 1000 mg via INTRAVENOUS
  Filled 2015-04-13: qty 200

## 2015-04-14 ENCOUNTER — Encounter (HOSPITAL_COMMUNITY): Payer: Self-pay | Admitting: *Deleted

## 2015-04-14 ENCOUNTER — Inpatient Hospital Stay (HOSPITAL_COMMUNITY)
Admission: RE | Admit: 2015-04-14 | Discharge: 2015-04-16 | DRG: 460 | Disposition: A | Discharge status: Home / Self Care | Payer: 59 | Source: Ambulatory Visit | Attending: Neurosurgery | Admitting: Neurosurgery

## 2015-04-14 ENCOUNTER — Inpatient Hospital Stay (HOSPITAL_COMMUNITY): Payer: 59

## 2015-04-14 ENCOUNTER — Inpatient Hospital Stay (HOSPITAL_COMMUNITY): Payer: 59 | Admitting: Certified Registered Nurse Anesthetist

## 2015-04-14 ENCOUNTER — Encounter (HOSPITAL_COMMUNITY)
Admission: RE | Disposition: A | Discharge status: Home / Self Care | Payer: Self-pay | Source: Ambulatory Visit | Attending: Neurosurgery

## 2015-04-14 DIAGNOSIS — M1990 Unspecified osteoarthritis, unspecified site: Secondary | ICD-10-CM | POA: Diagnosis present

## 2015-04-14 DIAGNOSIS — M79605 Pain in left leg: Secondary | ICD-10-CM | POA: Diagnosis present

## 2015-04-14 DIAGNOSIS — F419 Anxiety disorder, unspecified: Secondary | ICD-10-CM | POA: Diagnosis present

## 2015-04-14 DIAGNOSIS — K219 Gastro-esophageal reflux disease without esophagitis: Secondary | ICD-10-CM | POA: Diagnosis present

## 2015-04-14 DIAGNOSIS — M5126 Other intervertebral disc displacement, lumbar region: Secondary | ICD-10-CM | POA: Diagnosis present

## 2015-04-14 DIAGNOSIS — Z419 Encounter for procedure for purposes other than remedying health state, unspecified: Secondary | ICD-10-CM

## 2015-04-14 DIAGNOSIS — Z79899 Other long term (current) drug therapy: Secondary | ICD-10-CM

## 2015-04-14 SURGERY — POSTERIOR LUMBAR FUSION 1 LEVEL
Anesthesia: General

## 2015-04-14 MED ORDER — FENTANYL CITRATE (PF) 250 MCG/5ML IJ SOLN
INTRAMUSCULAR | Status: AC
  Filled 2015-04-14: qty 5

## 2015-04-14 MED ORDER — ACETAMINOPHEN 325 MG PO TABS: 650.0000 mg | ORAL_TABLET | ORAL | Status: DC | PRN

## 2015-04-14 MED ORDER — MORPHINE SULFATE (PF) 2 MG/ML IV SOLN
1.0000 mg | INTRAVENOUS | Status: DC | PRN
  Administered 2015-04-14: 2 mg via INTRAVENOUS
  Administered 2015-04-15: 4 mg via INTRAVENOUS
  Administered 2015-04-15: 3 mg via INTRAVENOUS
  Filled 2015-04-14: qty 1
  Filled 2015-04-14 (×2): qty 2

## 2015-04-14 MED ORDER — LIDOCAINE HCL (CARDIAC) 20 MG/ML IV SOLN
INTRAVENOUS | Status: DC | PRN
  Administered 2015-04-14: 80 mg via INTRAVENOUS

## 2015-04-14 MED ORDER — ONDANSETRON HCL 4 MG/2ML IJ SOLN: 4.0000 mg | INTRAMUSCULAR | Status: DC | PRN

## 2015-04-14 MED ORDER — PROPOFOL 10 MG/ML IV BOLUS
INTRAVENOUS | Status: AC
  Filled 2015-04-14: qty 20

## 2015-04-14 MED ORDER — CITALOPRAM HYDROBROMIDE 20 MG PO TABS
20.0000 mg | ORAL_TABLET | Freq: Every evening | ORAL | Status: DC
  Administered 2015-04-14 – 2015-04-15 (×2): 20 mg via ORAL
  Filled 2015-04-14 (×2): qty 1

## 2015-04-14 MED ORDER — MIDAZOLAM HCL 2 MG/2ML IJ SOLN
INTRAMUSCULAR | Status: AC
  Filled 2015-04-14: qty 2

## 2015-04-14 MED ORDER — BISACODYL 5 MG PO TBEC: 5.0000 mg | DELAYED_RELEASE_TABLET | Freq: Every day | ORAL | Status: DC | PRN

## 2015-04-14 MED ORDER — MAGNESIUM CITRATE PO SOLN: 1.0000 | Freq: Once | ORAL | Status: DC | PRN

## 2015-04-14 MED ORDER — MIDAZOLAM HCL 5 MG/5ML IJ SOLN
INTRAMUSCULAR | Status: DC | PRN
  Administered 2015-04-14: 2 mg via INTRAVENOUS

## 2015-04-14 MED ORDER — HYDROCODONE-ACETAMINOPHEN 5-325 MG PO TABS
1.0000 | ORAL_TABLET | ORAL | Status: DC | PRN
  Administered 2015-04-14: 2 via ORAL
  Filled 2015-04-14: qty 2

## 2015-04-14 MED ORDER — PROPRANOLOL HCL ER 60 MG PO CP24
60.0000 mg | ORAL_CAPSULE | Freq: Every day | ORAL | Status: DC
  Administered 2015-04-15 – 2015-04-16 (×2): 60 mg via ORAL
  Filled 2015-04-14 (×2): qty 1

## 2015-04-14 MED ORDER — THROMBIN 20000 UNITS EX SOLR
CUTANEOUS | Status: DC | PRN
  Administered 2015-04-14: 15:00:00 via TOPICAL

## 2015-04-14 MED ORDER — FENTANYL CITRATE (PF) 100 MCG/2ML IJ SOLN
INTRAMUSCULAR | Status: DC | PRN
  Administered 2015-04-14: 25 ug via INTRAVENOUS
  Administered 2015-04-14: 100 ug via INTRAVENOUS
  Administered 2015-04-14 (×3): 50 ug via INTRAVENOUS
  Administered 2015-04-14: 25 ug via INTRAVENOUS
  Administered 2015-04-14 (×2): 50 ug via INTRAVENOUS
  Administered 2015-04-14: 100 ug via INTRAVENOUS

## 2015-04-14 MED ORDER — ROCURONIUM BROMIDE 50 MG/5ML IV SOLN
INTRAVENOUS | Status: AC
  Filled 2015-04-14: qty 1

## 2015-04-14 MED ORDER — LACTATED RINGERS IV SOLN
INTRAVENOUS | Status: DC
  Administered 2015-04-14 (×3): via INTRAVENOUS

## 2015-04-14 MED ORDER — SENNOSIDES-DOCUSATE SODIUM 8.6-50 MG PO TABS
1.0000 | ORAL_TABLET | Freq: Every evening | ORAL | Status: DC | PRN
  Administered 2015-04-15: 1 via ORAL
  Filled 2015-04-14: qty 1

## 2015-04-14 MED ORDER — FENTANYL CITRATE (PF) 100 MCG/2ML IJ SOLN
25.0000 ug | INTRAMUSCULAR | Status: DC | PRN
  Administered 2015-04-14 (×2): 50 ug via INTRAVENOUS

## 2015-04-14 MED ORDER — PHENYLEPHRINE 40 MCG/ML (10ML) SYRINGE FOR IV PUSH (FOR BLOOD PRESSURE SUPPORT)
PREFILLED_SYRINGE | INTRAVENOUS | Status: AC
  Filled 2015-04-14: qty 10

## 2015-04-14 MED ORDER — DEXAMETHASONE SODIUM PHOSPHATE 4 MG/ML IJ SOLN
INTRAMUSCULAR | Status: DC | PRN
  Administered 2015-04-14: 4 mg via INTRAVENOUS

## 2015-04-14 MED ORDER — NEOSTIGMINE METHYLSULFATE 10 MG/10ML IV SOLN
INTRAVENOUS | Status: AC
Start: 2015-04-14 — End: 2015-04-14
  Filled 2015-04-14: qty 1

## 2015-04-14 MED ORDER — BUPIVACAINE HCL 0.5 % IJ SOLN
INTRAMUSCULAR | Status: DC | PRN
  Administered 2015-04-14: 30 mL

## 2015-04-14 MED ORDER — SODIUM CHLORIDE 0.9% FLUSH
3.0000 mL | Freq: Two times a day (BID) | INTRAVENOUS | Status: DC
  Administered 2015-04-14 – 2015-04-16 (×3): 3 mL via INTRAVENOUS

## 2015-04-14 MED ORDER — PROMETHAZINE HCL 25 MG/ML IJ SOLN: 6.2500 mg | INTRAMUSCULAR | Status: DC | PRN

## 2015-04-14 MED ORDER — ACETAMINOPHEN 650 MG RE SUPP: 650.0000 mg | RECTAL | Status: DC | PRN

## 2015-04-14 MED ORDER — CYCLOBENZAPRINE HCL 10 MG PO TABS
10.0000 mg | ORAL_TABLET | Freq: Three times a day (TID) | ORAL | Status: DC | PRN
  Administered 2015-04-14 – 2015-04-16 (×5): 10 mg via ORAL
  Filled 2015-04-14 (×6): qty 1

## 2015-04-14 MED ORDER — PHENYLEPHRINE HCL 10 MG/ML IJ SOLN
10.0000 mg | INTRAVENOUS | Status: DC | PRN
  Administered 2015-04-14: 5 ug/min via INTRAVENOUS

## 2015-04-14 MED ORDER — GLYCOPYRROLATE 0.2 MG/ML IJ SOLN
INTRAMUSCULAR | Status: AC
  Filled 2015-04-14: qty 2

## 2015-04-14 MED ORDER — ROCURONIUM BROMIDE 100 MG/10ML IV SOLN
INTRAVENOUS | Status: DC | PRN
  Administered 2015-04-14: 50 mg via INTRAVENOUS
  Administered 2015-04-14: 5 mg via INTRAVENOUS
  Administered 2015-04-14 (×2): 10 mg via INTRAVENOUS
  Administered 2015-04-14: 5 mg via INTRAVENOUS
  Administered 2015-04-14: 20 mg via INTRAVENOUS

## 2015-04-14 MED ORDER — ONDANSETRON HCL 4 MG/2ML IJ SOLN
INTRAMUSCULAR | Status: AC
  Filled 2015-04-14: qty 2

## 2015-04-14 MED ORDER — DROSPIRENONE-ETHINYL ESTRADIOL 3-0.03 MG PO TABS: 1.0000 | ORAL_TABLET | Freq: Every day | ORAL | Status: DC

## 2015-04-14 MED ORDER — 0.9 % SODIUM CHLORIDE (POUR BTL) OPTIME
TOPICAL | Status: DC | PRN
  Administered 2015-04-14: 1000 mL

## 2015-04-14 MED ORDER — GLYCOPYRROLATE 0.2 MG/ML IJ SOLN
INTRAMUSCULAR | Status: DC | PRN
  Administered 2015-04-14: .6 mg via INTRAVENOUS

## 2015-04-14 MED ORDER — PHENOL 1.4 % MT LIQD: 1.0000 | OROMUCOSAL | Status: DC | PRN

## 2015-04-14 MED ORDER — PHENYLEPHRINE HCL 10 MG/ML IJ SOLN
INTRAMUSCULAR | Status: DC | PRN
  Administered 2015-04-14: 20 ug via INTRAVENOUS
  Administered 2015-04-14: 40 ug via INTRAVENOUS
  Administered 2015-04-14 (×2): 80 ug via INTRAVENOUS
  Administered 2015-04-14: 40 ug via INTRAVENOUS
  Administered 2015-04-14 (×2): 80 ug via INTRAVENOUS

## 2015-04-14 MED ORDER — OXYCODONE-ACETAMINOPHEN 5-325 MG PO TABS
1.0000 | ORAL_TABLET | ORAL | Status: DC | PRN
  Administered 2015-04-14 – 2015-04-16 (×6): 2 via ORAL
  Filled 2015-04-14 (×6): qty 2

## 2015-04-14 MED ORDER — ZOLPIDEM TARTRATE 5 MG PO TABS: 5.0000 mg | ORAL_TABLET | Freq: Every evening | ORAL | Status: DC | PRN

## 2015-04-14 MED ORDER — DOCUSATE SODIUM 100 MG PO CAPS
100.0000 mg | ORAL_CAPSULE | Freq: Two times a day (BID) | ORAL | Status: DC
  Administered 2015-04-14 – 2015-04-16 (×4): 100 mg via ORAL
  Filled 2015-04-14 (×4): qty 1

## 2015-04-14 MED ORDER — NEOSTIGMINE METHYLSULFATE 10 MG/10ML IV SOLN
INTRAVENOUS | Status: DC | PRN
  Administered 2015-04-14: 4 mg via INTRAVENOUS

## 2015-04-14 MED ORDER — POTASSIUM CHLORIDE IN NACL 20-0.9 MEQ/L-% IV SOLN
INTRAVENOUS | Status: DC
  Filled 2015-04-14 (×5): qty 1000

## 2015-04-14 MED ORDER — DEXAMETHASONE SODIUM PHOSPHATE 4 MG/ML IJ SOLN
INTRAMUSCULAR | Status: AC
  Filled 2015-04-14: qty 1

## 2015-04-14 MED ORDER — SODIUM CHLORIDE 0.9% FLUSH: 3.0000 mL | INTRAVENOUS | Status: DC | PRN

## 2015-04-14 MED ORDER — ONDANSETRON HCL 4 MG/2ML IJ SOLN
INTRAMUSCULAR | Status: DC | PRN
  Administered 2015-04-14: 4 mg via INTRAVENOUS

## 2015-04-14 MED ORDER — LIDOCAINE HCL (CARDIAC) 20 MG/ML IV SOLN
INTRAVENOUS | Status: AC
  Filled 2015-04-14: qty 5

## 2015-04-14 MED ORDER — SODIUM CHLORIDE 0.9 % IV SOLN: 250.0000 mL | INTRAVENOUS | Status: DC

## 2015-04-14 MED ORDER — PROPOFOL 10 MG/ML IV BOLUS
INTRAVENOUS | Status: DC | PRN
  Administered 2015-04-14: 10 mg via INTRAVENOUS
  Administered 2015-04-14: 180 mg via INTRAVENOUS
  Administered 2015-04-14: 20 mg via INTRAVENOUS
  Administered 2015-04-14: 10 mg via INTRAVENOUS

## 2015-04-14 MED ORDER — FENTANYL CITRATE (PF) 100 MCG/2ML IJ SOLN
INTRAMUSCULAR | Status: AC
  Administered 2015-04-14: 50 ug via INTRAVENOUS
  Filled 2015-04-14: qty 2

## 2015-04-14 MED ORDER — MENTHOL 3 MG MT LOZG: 1.0000 | LOZENGE | OROMUCOSAL | Status: DC | PRN

## 2015-04-14 SURGICAL SUPPLY — 65 items
APL SKNCLS STERI-STRIP NONHPOA (GAUZE/BANDAGES/DRESSINGS)
BENZOIN TINCTURE PRP APPL 2/3 (GAUZE/BANDAGES/DRESSINGS) IMPLANT
BIT DRILL PLIF MAS 5.0MM DISP (DRILL) IMPLANT
BLADE CLIPPER SURG (BLADE) IMPLANT
BUR MATCHSTICK NEURO 3.0 LAGG (BURR) ×3 IMPLANT
BUR PRECISION FLUTE 5.0 (BURR) ×3 IMPLANT
CANISTER SUCT 3000ML PPV (MISCELLANEOUS) ×3 IMPLANT
CLOSURE WOUND 1/2 X4 (GAUZE/BANDAGES/DRESSINGS)
CONT SPEC 4OZ CLIKSEAL STRL BL (MISCELLANEOUS) ×3 IMPLANT
COVER BACK TABLE 60X90IN (DRAPES) ×3 IMPLANT
DECANTER SPIKE VIAL GLASS SM (MISCELLANEOUS) ×3 IMPLANT
DRAPE C-ARM 42X72 X-RAY (DRAPES) ×6 IMPLANT
DRAPE C-ARMOR (DRAPES) IMPLANT
DRAPE LAPAROTOMY 100X72X124 (DRAPES) ×3 IMPLANT
DRAPE POUCH INSTRU U-SHP 10X18 (DRAPES) ×3 IMPLANT
DRAPE SURG 17X23 STRL (DRAPES) ×3 IMPLANT
DRILL PLIF MAS 5.0MM DISP (DRILL) ×3
DRSG OPSITE POSTOP 4X6 (GAUZE/BANDAGES/DRESSINGS) ×2 IMPLANT
DURAPREP 26ML APPLICATOR (WOUND CARE) ×3 IMPLANT
ELECT REM PT RETURN 9FT ADLT (ELECTROSURGICAL) ×3
ELECTRODE REM PT RTRN 9FT ADLT (ELECTROSURGICAL) ×1 IMPLANT
GAUZE SPONGE 4X4 12PLY STRL (GAUZE/BANDAGES/DRESSINGS) IMPLANT
GAUZE SPONGE 4X4 16PLY XRAY LF (GAUZE/BANDAGES/DRESSINGS) IMPLANT
GLOVE ECLIPSE 6.5 STRL STRAW (GLOVE) ×6 IMPLANT
GLOVE EXAM NITRILE LRG STRL (GLOVE) IMPLANT
GLOVE EXAM NITRILE MD LF STRL (GLOVE) IMPLANT
GLOVE EXAM NITRILE XL STR (GLOVE) IMPLANT
GLOVE EXAM NITRILE XS STR PU (GLOVE) IMPLANT
GOWN STRL REUS W/ TWL LRG LVL3 (GOWN DISPOSABLE) ×2 IMPLANT
GOWN STRL REUS W/ TWL XL LVL3 (GOWN DISPOSABLE) IMPLANT
GOWN STRL REUS W/TWL 2XL LVL3 (GOWN DISPOSABLE) IMPLANT
GOWN STRL REUS W/TWL LRG LVL3 (GOWN DISPOSABLE) ×6
GOWN STRL REUS W/TWL XL LVL3 (GOWN DISPOSABLE)
KIT BASIN OR (CUSTOM PROCEDURE TRAY) ×3 IMPLANT
KIT POSITION SURG JACKSON T1 (MISCELLANEOUS) ×3 IMPLANT
KIT ROOM TURNOVER OR (KITS) ×3 IMPLANT
LIQUID BAND (GAUZE/BANDAGES/DRESSINGS) ×3 IMPLANT
NDL HYPO 25X1 1.5 SAFETY (NEEDLE) ×1 IMPLANT
NDL SPNL 18GX3.5 QUINCKE PK (NEEDLE) IMPLANT
NEEDLE HYPO 25X1 1.5 SAFETY (NEEDLE) ×3 IMPLANT
NEEDLE SPNL 18GX3.5 QUINCKE PK (NEEDLE) IMPLANT
NS IRRIG 1000ML POUR BTL (IV SOLUTION) ×3 IMPLANT
PACK LAMINECTOMY NEURO (CUSTOM PROCEDURE TRAY) ×3 IMPLANT
PAD ARMBOARD 7.5X6 YLW CONV (MISCELLANEOUS) ×6 IMPLANT
ROD 35MM (Rod) ×2 IMPLANT
ROD 5.5X40MM (Rod) ×2 IMPLANT
SCREW LOCK (Screw) ×12 IMPLANT
SCREW LOCK FXNS SPNE MAS PL (Screw) IMPLANT
SCREW SHANK 6.5X40 (Screw) ×6 IMPLANT
SCREW SHANK 6.5X40 NS LF (Screw) IMPLANT
SCREW SHANKS 5.5X35 (Screw) ×4 IMPLANT
SCREW TULIP 5.5 (Screw) ×8 IMPLANT
SPACER VERTERBRAL 9MM (Orthopedic Implant) ×2 IMPLANT
SPONGE LAP 4X18 X RAY DECT (DISPOSABLE) IMPLANT
SPONGE SURGIFOAM ABS GEL 100 (HEMOSTASIS) ×3 IMPLANT
STRIP CLOSURE SKIN 1/2X4 (GAUZE/BANDAGES/DRESSINGS) IMPLANT
SUT PROLENE 6 0 BV (SUTURE) IMPLANT
SUT VIC AB 0 CT1 18XCR BRD8 (SUTURE) ×1 IMPLANT
SUT VIC AB 0 CT1 8-18 (SUTURE) ×6
SUT VIC AB 2-0 CT1 18 (SUTURE) ×3 IMPLANT
SUT VIC AB 3-0 SH 8-18 (SUTURE) ×5 IMPLANT
TOWEL OR 17X24 6PK STRL BLUE (TOWEL DISPOSABLE) ×3 IMPLANT
TOWEL OR 17X26 10 PK STRL BLUE (TOWEL DISPOSABLE) ×3 IMPLANT
TRAY FOLEY W/METER SILVER 14FR (SET/KITS/TRAYS/PACK) ×3 IMPLANT
WATER STERILE IRR 1000ML POUR (IV SOLUTION) ×3 IMPLANT

## 2015-04-14 NOTE — Anesthesia Procedure Notes (Signed)
Procedure Name: Intubation Date/Time: 04/14/2015 1:57 PM Performed by: Merdis Delay Pre-anesthesia Checklist: Patient identified, Timeout performed, Emergency Drugs available, Suction available and Patient being monitored Patient Re-evaluated:Patient Re-evaluated prior to inductionOxygen Delivery Method: Circle system utilized Preoxygenation: Pre-oxygenation with 100% oxygen Intubation Type: IV induction Ventilation: Mask ventilation without difficulty Laryngoscope Size: Mac and 3 Grade View: Grade I Tube type: Oral Tube size: 7.5 mm Number of attempts: 1 Airway Equipment and Method: Stylet Placement Confirmation: ETT inserted through vocal cords under direct vision,  breath sounds checked- equal and bilateral,  positive ETCO2 and CO2 detector Secured at: 22 cm Tube secured with: Tape

## 2015-04-14 NOTE — Anesthesia Preprocedure Evaluation (Addendum)
Anesthesia Evaluation  Patient identified by MRN, date of birth, ID band Patient awake    Reviewed: Allergy & Precautions, NPO status , reviewed documented beta blocker date and time   Airway Mallampati: II  TM Distance: >3 FB Neck ROM: Full    Dental  (+) Teeth Intact, Dental Advisory Given   Pulmonary neg pulmonary ROS,    breath sounds clear to auscultation       Cardiovascular negative cardio ROS   Rhythm:Regular Rate:Normal     Neuro/Psych negative neurological ROS     GI/Hepatic Neg liver ROS, GERD  ,  Endo/Other  negative endocrine ROS  Renal/GU negative Renal ROS     Musculoskeletal   Abdominal   Peds  Hematology   Anesthesia Other Findings   Reproductive/Obstetrics                            Anesthesia Physical Anesthesia Plan  ASA: II  Anesthesia Plan: General   Post-op Pain Management:    Induction: Intravenous  Airway Management Planned: Oral ETT  Additional Equipment:   Intra-op Plan:   Post-operative Plan: Extubation in OR  Informed Consent: I have reviewed the patients History and Physical, chart, labs and discussed the procedure including the risks, benefits and alternatives for the proposed anesthesia with the patient or authorized representative who has indicated his/her understanding and acceptance.   Dental advisory given  Plan Discussed with: CRNA, Anesthesiologist and Surgeon  Anesthesia Plan Comments:         Anesthesia Quick Evaluation

## 2015-04-14 NOTE — Transfer of Care (Signed)
Immediate Anesthesia Transfer of Care Note  Patient: Inta Suski  Procedure(s) Performed: Procedure(s) with comments: Lumbar five sacral one posterior lumbar interbody fusion wit interbody prosthesis posterior lateral arthrodesis posterior nonsegmental instrumentation (N/A) - L5S1 posterior lumbar interbody fusion with interbody prosthesis posterior lateral arthrodesis and posterior nonsegmental instrumentation  Patient Location: PACU  Anesthesia Type:General  Level of Consciousness: awake, alert  and oriented  Airway & Oxygen Therapy: Patient Spontanous Breathing  Post-op Assessment: Report given to RN and Post -op Vital signs reviewed and stable  Post vital signs: Reviewed and stable  Last Vitals:  Filed Vitals:   04/14/15 1052  BP: 124/69  Pulse: 88  Temp: 37.2 C  Resp: 18    Complications: No apparent anesthesia complications

## 2015-04-14 NOTE — Op Note (Signed)
04/14/2015  6:09 PM  PATIENT:  Stacy Villanueva  45 y.o. female with severe ongoing pain in the left lower extremity with what appears to be a 2nd recurrent disc herniation at the L5/S1 level. The disc is eccentric to the left side. She did report today new pain in the right lower extremity.  PRE-OPERATIVE DIAGNOSIS:  lumbar herniated disc, 2nd recurrence Left L5/S1  POST-OPERATIVE DIAGNOSIS:  lumbar herniated disc, 2nd recurrence Left L5/S1  PROCEDURE:  Procedure(s): Lumbar five sacral one posterior lumbar interbody fusion wit interbody prosthesis posterior lateral arthrodesis posterior nonsegmental instrumentation  SURGEON:  Surgeon(s): Ashok Pall, MD Erline Levine, MD  ASSISTANTS:Stern, Broadus John  ANESTHESIA:   general  EBL:  Total I/O In: 2300 [I.V.:2300] Out: 800 [Urine:550; Blood:250]  BLOOD ADMINISTERED:none  CELL SAVER GIVEN:none  COUNT:per nursing  DRAINS: none   SPECIMEN:  No Specimen  DICTATION: [name] is a 45 y.o. female whom was taken to the operating room intubated, and placed under a general anesthetic without difficulty. A foley catheter was placed under sterile conditions. She was positioned prone on a Jackson stable with all pressure points properly padded.  Her lumbar region was prepped and draped in a sterile manner. I infiltrated 10cc's 1/2%lidocaine/1:2000,000 strength epinephrine into the planned incision. I opened the skin with a 10 blade and took the incision down to the thoracolumbar fascia. I exposed the lamina of L5, and S1 in a subperiosteal fashion bilaterally. I confirmed my location with an intraoperative xray.  I placed self retaining retractors and started the decompression.  I decompressed the spinal canal bilaterally by taking the inferior facet of L5 and exposing the L5/S1 neural foramina. I did this on the right side to ensure that there was no compressive cause for the new pain in her right lower extremity. On the left side I opened the disc  space and removed the disc on the left side. I when finished did not appreciate any pressure on the L5, or the S1 root. I used Kerrison punches, the drill and curettes in order to fully decompress the spinal canal, and the L5, and S1 roots. This decompression was well beyond the needed exposure for a simple PLIF.  A PLIF was performed at L5/S1 on the left side . I opened the disc space with a 15 blade then used a variety of instruments to remove the disc and prepare the space for the arthrodesis. I used curettes, rongeurs, punches, shavers for the disc space, and rasps in the discetomy. I measured the disc space and placed a single Peek cage 9x25mm (Synthes) into the disc space(s). I also placed morsels of autograft into the disc space. I decorticated the lateral bone at L5, and S1. I then placed morsels of autograft on the decorticated surfaces to complete the posterolateral arthrodesis.  I placed pedicle screws at L5, and S1, using fluoroscopic guidance. I drilled a pilot hole, then cannulated the pedicle with a drill at each site. I then tapped each pedicle, assessing each site for pedicle violations. No cutouts were appreciated. Screws (nuvasive mas plif) were then placed at each site without difficulty. Final films were performed and all screws appeared to be in good position. Rods were attached and locking screws were placed to secure the construct. We closed the wound in a layered fashion. We approximated the thoracolumbar fascia, subcutaneous, and subcuticular planes with vicryl sutures. I used dermabond and an occlusive bandage for a sterile dressing.     PLAN OF CARE: Admit to inpatient  PATIENT DISPOSITION:  PACU - hemodynamically stable.   Delay start of Pharmacological VTE agent (>24hrs) due to surgical blood loss or risk of bleeding:  yes

## 2015-04-14 NOTE — Progress Notes (Signed)
Pt arrived to 5C18 @ 1829, Pt A&Ox 4, c/o pain 0/10. Dressing CDI, no drains.Fluids runningat 75 cc/hr. Foley intact, unclamped. Pt without distress. Family at the bedside. Diet ordered, will monitor.

## 2015-04-14 NOTE — H&P (Signed)
BP 124/69 mmHg  Pulse 88  Temp(Src) 98.9 F (37.2 C)  Resp 18  Ht 5\' 6"  (1.676 m)  Wt 78.472 kg (173 lb)  BMI 27.94 kg/m2  SpO2 99%  LMP 03/31/2015 [name] returns today. MRI shows that she may very well have a recurrence again on the left side. She has more dysarthria now than she did prior to the most recent operation that we did. The S1 nerve root is certainly being displaced. Given the fact that she has still had absolutely no relief, that she has this think which very much looks like a recurrence, that she has a retrolisthesis of L5 on S1, and that that level clearly dynamically is not stable, I would at this point like to just proceed with a lumbar fusion at L5-S1 on the left side. I am not sure at all that I would do anything to inspect the right side, as she is not complaining of pain on the right side, just the left.  PHYSICAL EXAMINATION: Vital signs are as follows: Height 66 inches, weight 171 pounds, BMI is 27.6, blood pressure is 129/85, pulse is 93, respiratory rate is 16, temperature is 99.5. She is alert, oriented x4, and answering all questions appropriately. Obviously, in discomfort. Has a markedly antalgic gait. Full strength in the left lower extremity, but it is compromised to some degree by pain.  ALLERGIES: SHE HAS ALLERGIES TO CODEINE AND PENICILLIN. PENICILLIN CAUSES NAUSEA AND VOMITING.  MEDICATIONS: Citalopram, Flexeril, Valium, Dilaudid, Gabapentin, Gianvi, and Voltaren occasionally.  Family History  Problem Relation Age of Onset  . Hypertension Mother   . Hypertension Father    Social History   Social History  . Marital Status: Married    Spouse Name: N/A  . Number of Children: N/A  . Years of Education: N/A   Occupational History  . Not on file.   Social History Main Topics  . Smoking status: Never Smoker   . Smokeless tobacco: Never Used  . Alcohol Use: No  . Drug Use: No  . Sexual Activity: Yes    Birth Control/ Protection: Pill    Other Topics Concern  . Not on file   Social History Narrative   Past Medical History  Diagnosis Date  . Anxiety   . Arthritis   . Back pain   . Family history of adverse reaction to anesthesia     daughter is slow to wake up, slight nausea  . GERD (gastroesophageal reflux disease)     10/19/14- not currently   Past Surgical History  Procedure Laterality Date  . Lumbar laminectomy/decompression microdiscectomy Bilateral 08/18/2014    Procedure: LUMBAR FIVE-SACRAL ONE LUMBAR LAMINECTOMY/DECOMPRESSION MICRODISCECTOMY 1 LEVEL;  Surgeon: Ashok Pall, MD;  Location: [facility];  Service: Neurosurgery;  Laterality: Bilateral;  Bilat L5S1 microdiskectomy  . Lumbar laminectomy/decompression microdiscectomy Bilateral 10/20/2014    Procedure: Bilateral Fifth Lumbar Vertebra First Sacral Vertebra Microdiskectomy;  Surgeon: Ashok Pall, MD;  Location: [facility];  Service: Neurosurgery;  Laterality: Bilateral;  Bilateral L5S1 microdiskectomy   She is alert and oriented x 4, speech is clear and fluent Moving all extremities well Pain limited weakness in the left lower extremity, no weakness on right side Reflexes intact at the biceps, triceps, brachioradialis, knees, and ankles bilaterally Normal muscle tone, bulk, and coordination Negative romberg, Proprioception intact in the upper and lower extremities Admit for lumbar fusion at L5/S1 Stacy Villanueva just before today's case stated she is having pain in the right lower extremity,  therefore I will place hardware and decompress the right side. Risks and benefits including but not limited to bleeding, infection, fusion failure, hardware failure, need for further surgery, nerve damage,weakness, bowel and or bladder dysfunction were discussed. She understands and wishes to proceed.

## 2015-04-14 NOTE — Anesthesia Postprocedure Evaluation (Signed)
Anesthesia Post Note  Patient: Stacy Villanueva  Procedure(s) Performed: Procedure(s) (LRB): Lumbar five sacral one posterior lumbar interbody fusion wit interbody prosthesis posterior lateral arthrodesis posterior nonsegmental instrumentation (N/A)  Patient location during evaluation: PACU Anesthesia Type: General Level of consciousness: awake and alert Pain management: pain level controlled Vital Signs Assessment: post-procedure vital signs reviewed and stable Respiratory status: spontaneous breathing Cardiovascular status: blood pressure returned to baseline Anesthetic complications: no    Last Vitals:  Filed Vitals:   04/14/15 1806 04/14/15 1807  BP:  120/69  Pulse:  80  Temp: 36.3 C   Resp:  21    Last Pain:  Filed Vitals:   04/14/15 1808  PainSc: 4                  [name]

## 2015-04-15 ENCOUNTER — Encounter (HOSPITAL_COMMUNITY): Payer: Self-pay | Admitting: Neurosurgery

## 2015-04-15 MED ORDER — HYDROMORPHONE HCL 2 MG PO TABS
2.0000 mg | ORAL_TABLET | ORAL | Status: DC | PRN
  Administered 2015-04-15 – 2015-04-16 (×5): 2 mg via ORAL
  Filled 2015-04-15 (×5): qty 1

## 2015-04-15 NOTE — Progress Notes (Signed)
Orthopedic Tech Progress Note Patient Details:  Stacy Villanueva [birth year] [id number]  Patient ID: Stacy Villanueva, female   DOB: 1970-12-11, 45 y.o.   MRN: [id number] Called in [employer] brace order; spoke with Ramonita Lab, Demitra Danley 04/15/2015, 10:27 AM

## 2015-04-15 NOTE — Progress Notes (Signed)
Patient ID: Stacy Villanueva, female   DOB: Dec 11, 1970, 45 y.o.   MRN: [id number] BP 89/45 mmHg  Pulse 82  Temp(Src) 98.5 F (36.9 C) (Oral)  Resp 16  Ht 5\' 6"  (1.676 m)  Wt 78.472 kg (173 lb)  BMI 27.94 kg/m2  SpO2 97%  LMP 03/31/2015 Alert and oriented x 4, speech is clear, fluent Moving lower extremities well Wound is clean dry Probable discharge tomorrow.

## 2015-04-15 NOTE — Evaluation (Signed)
Occupational Therapy Evaluation Patient Details Name: Stacy Villanueva MRN: [id number] DOB: 12-29-1970 Today's Date: 04/15/2015    History of Present Illness Pt is a 45 y.o. female s/p PLIF L5-S1 04-14-15. PMH consists of 2 previous discectomies at same level.   Clinical Impression   Pt reports she was independent with ADLs PTA. Began safety, ADL, and back education with pt and husband; they verbalize understanding. Pt demonstrating good ability to adhere to back precautions during functional activities. Pt able to don back brace with min assist as it was her first time using brace. Pt planning to d/c home with 24/7 supervision from her husband. Pt would benefit from continued skilled OT to address established goals.    Follow Up Recommendations  No OT follow up;Supervision - Intermittent    Equipment Recommendations  3 in 1 bedside comode;Other (comment) Management consultant)    Recommendations for Other Services       Precautions / Restrictions Precautions Precautions: Back Precaution Comments: Educated on back precautions during functional activities. Required Braces or Orthoses: Spinal Brace Spinal Brace: Lumbar corset ([employer] delivered during session) Restrictions Weight Bearing Restrictions: No Other Position/Activity Restrictions: Brace not yet delivered from [employer] at time of PT eval.      Mobility Bed Mobility Overal bed mobility: Needs Assistance Bed Mobility: Rolling;Sidelying to Sit Rolling: Modified independent (Device/Increase time) Sidelying to sit: Min guard     Sit to sidelying: Min assist;HOB elevated General bed mobility comments: Min guard for safety. HOB flat and use of rails. Pt with good technique.  Transfers Overall transfer level: Needs assistance Equipment used: None Transfers: Sit to/from Stand Sit to Stand: Min guard Stand pivot transfers: Min guard       General transfer comment: Min guard for safety. Good hand placement and technique.     Balance Overall balance assessment: No apparent balance deficits (not formally assessed)                                          ADL Overall ADL's : Needs assistance/impaired Eating/Feeding: Independent;Sitting   Grooming: Supervision/safety;Standing Grooming Details (indicate cue type and reason): Educated on use of cups for oral care in order to maintain back precautions.     Lower Body Bathing: Minimal assistance;Sit to/from stand   Upper Body Dressing : Minimal assistance;Cueing for sequencing Upper Body Dressing Details (indicate cue type and reason): for donning back brace Lower Body Dressing: Supervision/safety;Sit to/from stand Lower Body Dressing Details (indicate cue type and reason): Pt able to cross foot over opposite knee. Husband available to assist as needed. Educated on compensatory strategies for LB ADLs. Toilet Transfer: Supervision/safety;Ambulation;BSC;RW (BSC over toilet) Toilet Transfer Details (indicate cue type and reason): Educated on use of 3 in 1 over toilet Toileting- Clothing Manipulation and Hygiene: Moderate assistance;Sit to/from stand Toileting - Clothing Manipulation Details (indicate cue type and reason): Assist for toilet hygiene. Educated on use of toilet aide for increased independence with toilet hygiene.   Tub/Shower Transfer Details (indicate cue type and reason): Educated on use of 3 in 1 in tub. Functional mobility during ADLs: Supervision/safety General ADL Comments: Pts husband present for OT session. [employer] arrived with lumbar brace during session. Educated on home safety, placing frequently used items at coutner top height, maintaining precautions during functional activities, donning back brace; pt verbalized understanding. Discussed Clinical biochemist for home; pt and husband agreeable.     Vision  Perception     Praxis      Pertinent Vitals/Pain Pain Assessment: 0-10 Pain Score: 7  Pain Location: back,  incision Pain Descriptors / Indicators: Aching;Shooting;Sore Pain Intervention(s): Limited activity within patient's tolerance;Monitored during session;Premedicated before session;Repositioned     Hand Dominance     Extremity/Trunk Assessment Upper Extremity Assessment Upper Extremity Assessment: Overall WFL for tasks assessed   Lower Extremity Assessment Lower Extremity Assessment: Defer to PT evaluation   Cervical / Trunk Assessment Cervical / Trunk Assessment: Other exceptions Cervical / Trunk Exceptions: s/p lumbar sx   Communication Communication Communication: No difficulties   Cognition Arousal/Alertness: Awake/alert Behavior During Therapy: WFL for tasks assessed/performed Overall Cognitive Status: Within Functional Limits for tasks assessed                     General Comments       Exercises       Shoulder Instructions      Home Living Family/patient expects to be discharged to:: Private residence Living Arrangements: Spouse/significant other Available Help at Discharge: Family;Available 24 hours/day Type of Home: Mobile home Home Access: Stairs to enter Entrance Stairs-Number of Steps: 5 Entrance Stairs-Rails: Right;Left;Can reach both Home Layout: One level     Bathroom Shower/Tub: Teacher, early years/pre: Standard     Home Equipment: None          Prior Functioning/Environment Level of Independence: Independent             OT Diagnosis: Acute pain   OT Problem List: Decreased knowledge of use of DME or AE;Decreased knowledge of precautions;Pain   OT Treatment/Interventions: Self-care/ADL training;Energy conservation;DME and/or AE instruction;Patient/family education    OT Goals(Current goals can be found in the care plan section) Acute Rehab OT Goals Patient Stated Goal: home OT Goal Formulation: With patient/family Time For Goal Achievement: 04/29/15 Potential to Achieve Goals: Good ADL Goals Pt Will Perform  Tub/Shower Transfer: Tub transfer;with supervision;ambulating;3 in 1 Additional ADL Goal #1: Pt will independently don/doff back brace.  Additional ADL Goal #2: Pt will independently verbalize 3/3 back precautions and maintain during ADL activity.  OT Frequency: Min 2X/week   Barriers to D/C:            Co-evaluation              End of Session Equipment Utilized During Treatment: Back brace  Activity Tolerance: Patient tolerated treatment well Patient left: with call bell/phone within reach;with family/visitor present;Other (comment) (sitting EOB)   Time: [id number] OT Time Calculation (min): 16 min Charges:  OT General Charges $OT Visit: 1 Procedure OT Evaluation $OT Eval Moderate Complexity: 1 Procedure G-Codes:     Binnie Kand M.S., OTR/L Pager: [phone]  04/15/2015, 11:34 AM

## 2015-04-15 NOTE — Care Management Note (Signed)
Case Management Note  Patient Details  Name: Stacy Villanueva MRN: [id number] Date of Birth: 20-Apr-1970  Subjective/Objective:                    Action/Plan: Patient was admitted for a PLIF. Lives at home with spouse. Will follow for discharge needs.  Expected Discharge Date:                  Expected Discharge Plan:     In-House Referral:     Discharge planning Services     Post Acute Care Choice:    Choice offered to:     DME Arranged:    DME Agency:     HH Arranged:    HH Agency:     Status of Service:  In process, will continue to follow  Medicare Important Message Given:    Date Medicare IM Given:    Medicare IM give by:    Date Additional Medicare IM Given:    Additional Medicare Important Message give by:     If discussed at [place] of Stay Meetings, dates discussed:    Additional Comments:  Rolm Baptise, RN 04/15/2015, 10:43 AM [phone]

## 2015-04-15 NOTE — OR Nursing (Signed)
Addendum made to implants by Carroll Kinds, RN 04/15/2015 @ 1254 caj

## 2015-04-15 NOTE — Evaluation (Addendum)
Physical Therapy Evaluation Patient Details Name: Stacy Villanueva MRN: [id number] DOB: 1970-07-29 Today's Date: 04/15/2015   History of Present Illness  Pt is a 45 y.o. female s/p PLIF L5-S1 04-14-15. PMH consists of 2 previous discectomies at same level.  Clinical Impression  Patient is s/p above surgery resulting in the deficits listed below (see PT Problem List). On eval, pt required min assist for supine to/from sit, min guard assist transfers and supervision gait 150 feet without A.D. Limited distance to 150 feet due to spinal brace yet to arrive. Pt reports she has been ambulating in the hallway with her husband. Pt will need to ascend 4 steps with 2 rails to enter her home. Patient will benefit from skilled PT to increase their independence and safety with mobility (while adhering to their precautions) to allow discharge to the venue listed below.     Follow Up Recommendations No PT follow up;Supervision - Intermittent    Equipment Recommendations  None recommended by PT    Recommendations for Other Services       Precautions / Restrictions Precautions Precautions: Back Precaution Comments: Educated pt on 3/3 precautions. Required Braces or Orthoses: Spinal Brace Spinal Brace: Lumbar corset Restrictions Weight Bearing Restrictions: No Other Position/Activity Restrictions: Brace not yet delivered from [employer] at time of PT eval.      Mobility  Bed Mobility Overal bed mobility: Needs Assistance Bed Mobility: Rolling;Sidelying to Sit;Sit to Sidelying Rolling: Modified independent (Device/Increase time) Sidelying to sit: Min assist;HOB elevated     Sit to sidelying: Min assist;HOB elevated General bed mobility comments: verbal cues for logroll technique. Pt has adjustable bed at home.  Transfers Overall transfer level: Needs assistance Equipment used: None Transfers: Sit to/from [facility] Sit to Stand: Min guard Stand pivot transfers: Min guard        General transfer comment: Verbal cues for sequencing/technique.  Ambulation/Gait Ambulation/Gait assistance: Supervision Ambulation Distance (Feet): 150 Feet Assistive device: None Gait Pattern/deviations: Step-through pattern;Decreased stride length Gait velocity: decreased   General Gait Details: Gait distance limited by no brace available at time of eval.  Stairs            Wheelchair Mobility    Modified Rankin (Stroke Patients Only)       Balance                                             Pertinent Vitals/Pain Pain Assessment: 0-10 Pain Score: 6  Pain Location: sx site Pain Descriptors / Indicators: Shooting;Aching;Tightness Pain Intervention(s): Monitored during session;Repositioned;Patient requesting pain meds-RN notified    Home Living Family/patient expects to be discharged to:: Private residence Living Arrangements: Spouse/significant other Available Help at Discharge: Family;Available 24 hours/day Type of Home: Mobile home Home Access: Stairs to enter Entrance Stairs-Rails: Right;Left;Can reach both Entrance Stairs-Number of Steps: 5 Home Layout: One level Home Equipment: None      Prior Function Level of Independence: Independent               Hand Dominance        Extremity/Trunk Assessment                         Communication   Communication: No difficulties  Cognition Arousal/Alertness: Awake/alert Behavior During Therapy: WFL for tasks assessed/performed Overall Cognitive Status: Within Functional Limits for tasks assessed  General Comments      Exercises        Assessment/Plan    PT Assessment Patient needs continued PT services  PT Diagnosis Difficulty walking;Acute pain   PT Problem List Decreased strength;Decreased activity tolerance;Decreased balance;Decreased mobility;Decreased knowledge of precautions;Pain;Decreased knowledge of use of DME  PT  Treatment Interventions DME instruction;Gait training;Stair training;Functional mobility training;Therapeutic activities;Therapeutic exercise;Patient/family education;Balance training   PT Goals (Current goals can be found in the Care Plan section) Acute Rehab PT Goals Patient Stated Goal: home PT Goal Formulation: With patient Time For Goal Achievement: 04/29/15 Potential to Achieve Goals: Good    Frequency Min 5X/week   Barriers to discharge        Co-evaluation               End of Session   Activity Tolerance: Patient tolerated treatment well Patient left: in chair;with family/visitor present;with call bell/phone within reach Nurse Communication: Mobility status;Patient requests pain meds         Time: [id number] PT Time Calculation (min) (ACUTE ONLY): 28 min   Charges:   PT Evaluation $PT Eval Low Complexity: 1 Procedure PT Treatments $Gait Training: 8-22 mins   PT G Codes:        Lorriane Shire 04/15/2015, 10:01 AM

## 2015-04-16 MED ORDER — DIAZEPAM 5 MG PO TABS: 5.0000 mg | ORAL_TABLET | Freq: Four times a day (QID) | ORAL | Status: DC | PRN

## 2015-04-16 MED ORDER — HYDROMORPHONE HCL 2 MG PO TABS: 2.0000 mg | ORAL_TABLET | ORAL | Status: DC | PRN

## 2015-04-16 NOTE — Discharge Instructions (Signed)

## 2015-04-16 NOTE — Discharge Summary (Signed)
  Physician Discharge Summary  Patient ID: Stacy Villanueva MRN: [id number] DOB/AGE: [birth year] 47 y.o.  Admit date: 04/14/2015 Discharge date: 04/16/2015  Admission Diagnoses:recurrent HNP L5/S1 left  Discharge Diagnoses: same Active Problems:   HNP (herniated nucleus pulposus), lumbar   Discharged Condition: good  Hospital Course: Stacy Villanueva was admitted and taken to the operating room for an uncomplicated,lumbar fusion at L5/S1 with pedicle screws(nuvasive). Post op her left lower extremity felt much better, but she now complains of increasing pain in the right lower extremity.No discetomy was performed on the right side, and the hardware is not impinging on the nerve root. Wound is clean dry, and without signs of infection. She has no weakness in the lower extremities  Treatments: surgery: as below Lumbar five sacral one posterior lumbar interbody fusion wit interbody prosthesis posterior lateral arthrodesis posterior nonsegmental instrumentation  Discharge Exam: Blood pressure 105/58, pulse 87, temperature 98.8 F (37.1 C), temperature source Oral, resp. rate 20, height 5\' 6"  (1.676 m), weight 78.472 kg (173 lb), last menstrual period 03/31/2015, SpO2 97 %. General appearance: alert, cooperative, appears stated age and mild distress  Disposition: 01-Home or Self Care lumbar herniated disc    Medication List    TAKE these medications        citalopram 20 MG tablet  Commonly known as:  CELEXA  Take 20 mg by mouth daily.     cyclobenzaprine 10 MG tablet  Commonly known as:  FLEXERIL  Take 1 tablet (10 mg total) by mouth 3 (three) times daily as needed for muscle spasms.     drospirenone-ethinyl estradiol 3-0.03 MG tablet  Commonly known as:  YASMIN,ZARAH,SYEDA  Take 1 tablet by mouth daily.     oxyCODONE-acetaminophen 5-325 MG tablet  Commonly known as:  PERCOCET/ROXICET  Take 1 tablet by mouth every 4 (four) hours as needed for severe pain (takes during the day time).      propranolol ER 60 MG 24 hr capsule  Commonly known as:  INDERAL LA  Take 60 mg by mouth daily.     STOOL SOFTENER PO  Take 2 capsules by mouth 2 (two) times daily.           Follow-up Information    Follow up with Susie Ehresman L, MD In 3 weeks.   Specialty:  Neurosurgery   Why:  call the office to make an appointment   Contact information:   [address] [place] [address] [phone]       Signed: Winfield Cunas 04/16/2015, 10:21 AM

## 2015-04-16 NOTE — Care Management Note (Signed)
Case Management Note  Patient Details  Name: Stacy Villanueva MRN: [id number] Date of Birth: 1970/10/07  Subjective/Objective:                  HNP (herniated nucleus pulposus), lumbar Action/Plan: Discharge planning Expected Discharge Date:  04/16/15              Expected Discharge Plan:  Home/Self Care  In-House Referral:     Discharge planning Services  CM Consult  Post Acute Care Choice:  Durable Medical Equipment Choice offered to:     DME Arranged:  3-N-1 DME Agency:  [place]:  NA [place] Agency:  NA  Status of Service:  Completed, signed off  Medicare Important Message Given:    Date Medicare IM Given:    Medicare IM give by:    Date Additional Medicare IM Given:    Additional Medicare Important Message give by:     If discussed at [place] of Stay Meetings, dates discussed:    Additional Comments: CM received call to please arrange for 3n1.  CM called [facility] DME rep, Merry Proud to please deliver 3n1 to room so pt can discharge.  No HHPT or other services recc or ordered.  No other CM needs were communicated. Dellie Catholic, RN 04/16/2015, 11:32 AM

## 2015-04-16 NOTE — Progress Notes (Addendum)
Occupational Therapy Treatment Patient Details Name: Stacy Villanueva MRN: [id number] DOB: 07-May-1970 Today's Date: 04/16/2015    History of present illness Pt is a 45 y.o. female s/p PLIF L5-S1 04-14-15. PMH consists of 2 previous discectomies at same level.   OT comments  Education provided on back precautions, toilet transfers with 3in1 and tub transfer safety. Discussed AE and pt plans to obtain reacher and long handle sponge at d./c. She verbalizes understanding of using tongs to help with toilet hygiene in order to adhere to back precautions. Husband present and is able to assist PRN at d/c. Pt planning d/c for today.   Follow Up Recommendations  No OT follow up;Supervision - Intermittent    Equipment Recommendations  3 in 1 bedside comode    Recommendations for Other Services      Precautions / Restrictions Precautions Precautions: Back Precaution Booklet Issued: Yes (comment) Precaution Comments: Reviewed back precautions and information with patient and husband. Pt able to state 2/3 precautions today on her own. Required Braces or Orthoses: Spinal Brace Spinal Brace: Lumbar corset;Applied in sitting position Restrictions Weight Bearing Restrictions: No       Mobility Bed Mobility               General bed mobility comments: Patient OOB  Transfers Overall transfer level: Needs assistance Equipment used: None Transfers: Sit to/from Stand Sit to Stand: Supervision         General transfer comment: cues for technique to help with not bending.     Balance                                   ADL                                     Tub/Shower Transfer Details (indicate cue type and reason): Pt able to transfer in and out of tub with supervision. Educated on safety techniques to adhere to back precautions when transferring in and out of tub. Discussed use of 3in1 as tubseat and AE (long handle sponge) to help with bathing. Educated  on husband providing supervision for safety.    General ADL Comments: Pt able to state 2/3 precautions today so reviewed all precautions. Discussed 3in1 to help with increasing comfort and safety with transferring on and off toilet. Discussed how to adjust to proper height. Pt having some difficulty rising from recliner so educated on making sure she scoots out to edge of chair more before attempting to stand. Pt's husband states he has been assisting with donning brace and is able to help at d/c. Pt states she plans to obtain AE including reacher and long handle sponge at d/c and also discussed use of tongs as toilet aid to help with hygiene.       Vision                     Perception     Praxis      Cognition   Behavior During Therapy: [facility] for tasks assessed/performed Overall Cognitive Status: Within Functional Limits for tasks assessed                       Extremity/Trunk Assessment               Exercises     Shoulder  Instructions       General Comments      Pertinent Vitals/ Pain       Pain Assessment: No/denies pain Pain Score: 3  Pain Location: back Pain Descriptors / Indicators: Aching Pain Intervention(s): Monitored during session;Repositioned  Home Living                                          Prior Functioning/Environment              Frequency Min 2X/week     Progress Toward Goals  OT Goals(current goals can now be found in the care plan section)  Progress towards OT goals: Progressing toward goals  Acute Rehab OT Goals Patient Stated Goal: home  Plan Discharge plan remains appropriate    Co-evaluation                 End of Session Equipment Utilized During Treatment: Back brace   Activity Tolerance Patient tolerated treatment well   Patient Left with family/visitor present   Nurse Communication          Time: [id number] OT Time Calculation (min): 13 min  Charges: OT General  Charges $OT Visit: 1 Procedure OT Treatments $Therapeutic Activity: 8-22 mins  Jules Schick  [id number] 04/16/2015, 12:36 PM

## 2015-04-16 NOTE — Progress Notes (Signed)
Physical Therapy Treatment Patient Details Name: Stacy Villanueva MRN: [id number] DOB: 1970-11-12 Today's Date: 04/16/2015    History of Present Illness Pt is a 45 y.o. female s/p PLIF L5-S1 04-14-15. PMH consists of 2 previous discectomies at same level.    PT Comments    Patient doing well with mobility and gait.  Able to negotiate stairs with assist of husband today.  Goals achieved - patient ready for d/c from PT perspective.  Recommend 3-in-1 BSC for home use.  Follow Up Recommendations  No PT follow up;Supervision - Intermittent     Equipment Recommendations  3in1 (PT)    Recommendations for Other Services       Precautions / Restrictions Precautions Precautions: Back Precaution Booklet Issued: Yes (comment) Precaution Comments: Reviewed back precautions and information with patient and husband Required Braces or Orthoses: Spinal Brace Spinal Brace: Lumbar corset;Applied in sitting position Restrictions Weight Bearing Restrictions: No    Mobility  Bed Mobility               General bed mobility comments: Patient OOB  Transfers Overall transfer level: Modified independent Equipment used: None Transfers: Sit to/from Stand Sit to Stand: Modified independent (Device/Increase time)         General transfer comment: Increased time from chair.  Patient reports difficulty from toilet.  Ambulation/Gait Ambulation/Gait assistance: Supervision Ambulation Distance (Feet): 300 Feet Assistive device: None Gait Pattern/deviations: Step-through pattern;Decreased stride length Gait velocity: decreased Gait velocity interpretation: Below normal speed for age/gender General Gait Details: Patient with good gait pattern and balance.  Slow guarded gait.   Stairs Stairs: Yes Stairs assistance: Min assist Stair Management: One rail Right;Step to pattern;Forwards Number of Stairs: 4 General stair comments: Instructed patient on step-to pattern.  Husband instructed on  assisting patient on stairs safely.    Wheelchair Mobility    Modified Rankin (Stroke Patients Only)       Balance                                    Cognition Arousal/Alertness: Awake/alert Behavior During Therapy: WFL for tasks assessed/performed Overall Cognitive Status: Within Functional Limits for tasks assessed                      Exercises      General Comments        Pertinent Vitals/Pain Pain Assessment: 0-10 Pain Score: 3  Pain Location: back Pain Descriptors / Indicators: Aching Pain Intervention(s): Monitored during session;Repositioned    Home Living                      Prior Function            PT Goals (current goals can now be found in the care plan section) Acute Rehab PT Goals Patient Stated Goal: home Progress towards PT goals: Goals met/education completed, patient discharged from PT    Frequency  Min 5X/week    PT Plan Current plan remains appropriate;Equipment recommendations need to be updated    Co-evaluation             End of Session Equipment Utilized During Treatment: Back brace Activity Tolerance: Patient tolerated treatment well Patient left: in chair;with call bell/phone within reach;with family/visitor present     Time: 1058-1110 PT Time Calculation (min) (ACUTE ONLY): 12 min  Charges:  $Gait Training: 8-22 mins  G Codes:      Despina Pole 04/16/2015, 11:23 AM Carita Pian. Sanjuana Kava, [place] Pager [phone]

## 2015-04-16 NOTE — Progress Notes (Signed)
Pt discharged from hospital per orders from MD. Pt educated on discharge instructions. Pt verbalized understanding of instructions. IV removed from pt prior to discharge. Pt able to ambulate out of hospital for discharge. All questions and concerns were addressed.

## 2016-05-28 IMAGING — CR DG LUMBAR SPINE 2-3V
2 series · 4 of 4 positions shown · non-contrast
Comparison: MRI 08/14/2014.

CLINICAL DATA: Intraoperative localization.  L5-S1 back operation.

EXAM:
LUMBAR SPINE - 2-3 VIEW

[Series 1: lat · 0.17mm/px · 2 of 2 slices shown (1 of 2)]
[im 1/2]
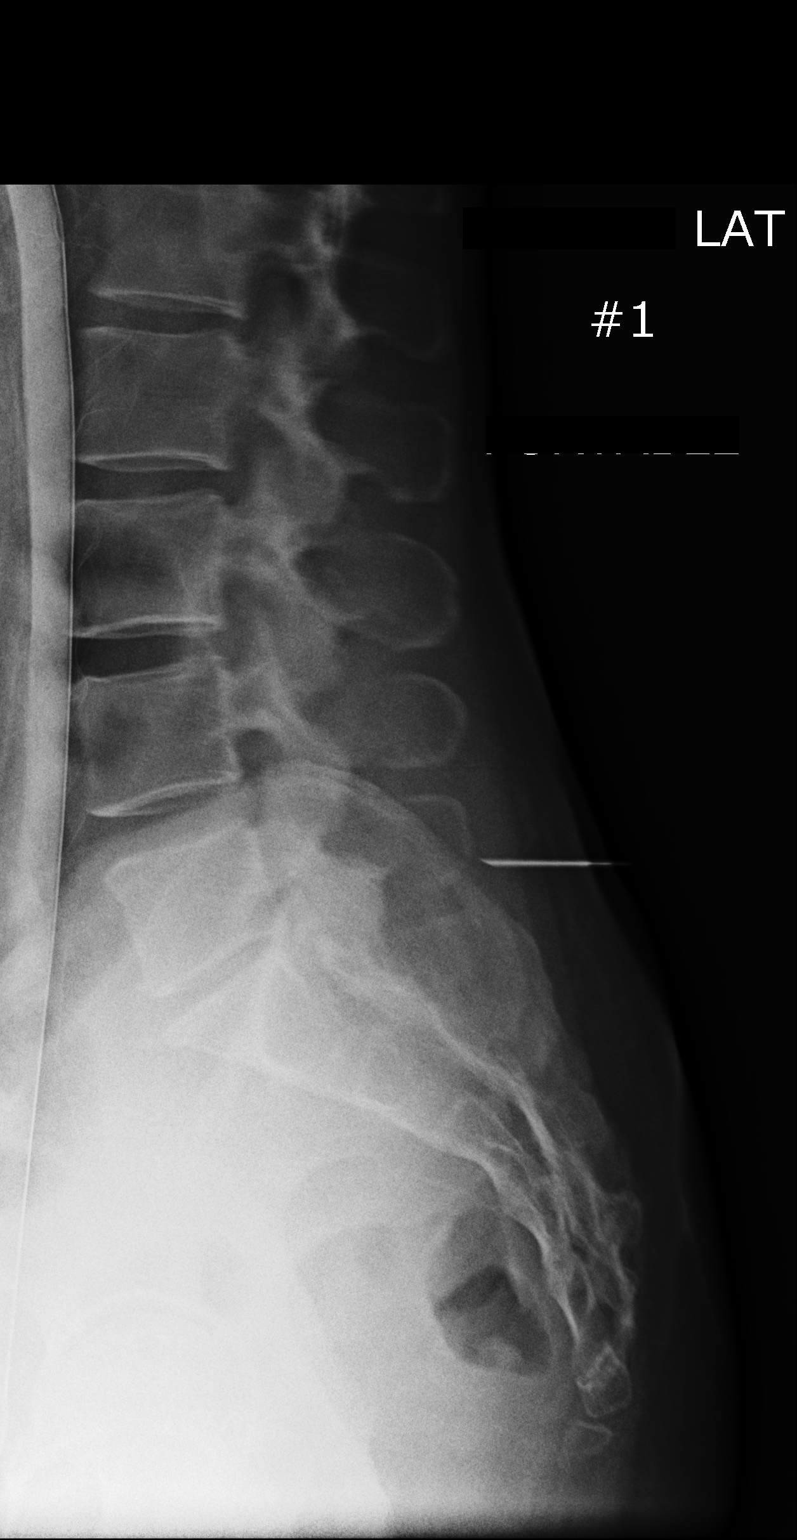
[im 2/2]
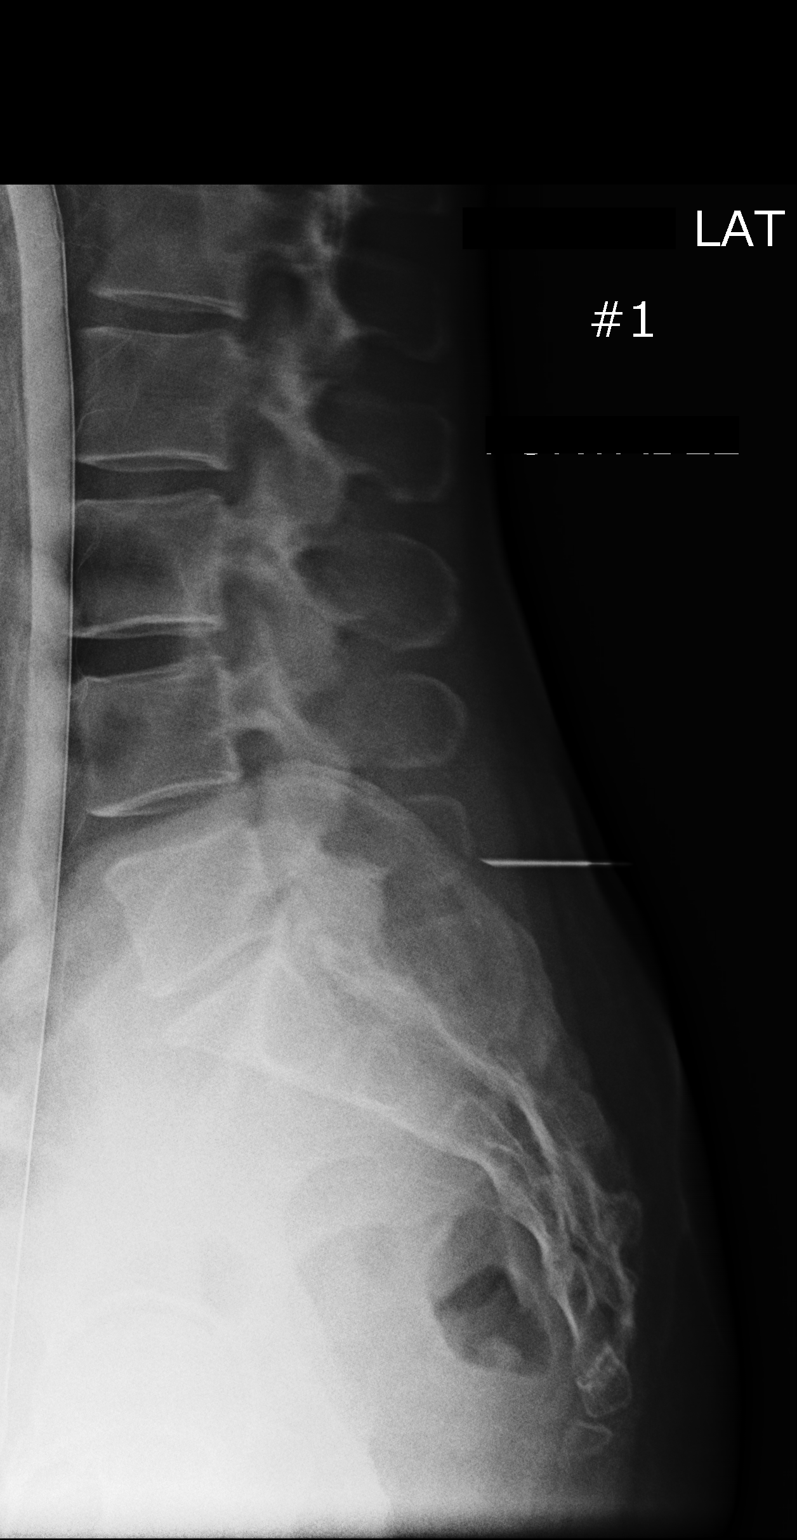

[Series 2: lat · 0.17mm/px · 2 of 2 slices shown (2 of 2)]
[im 1/2]
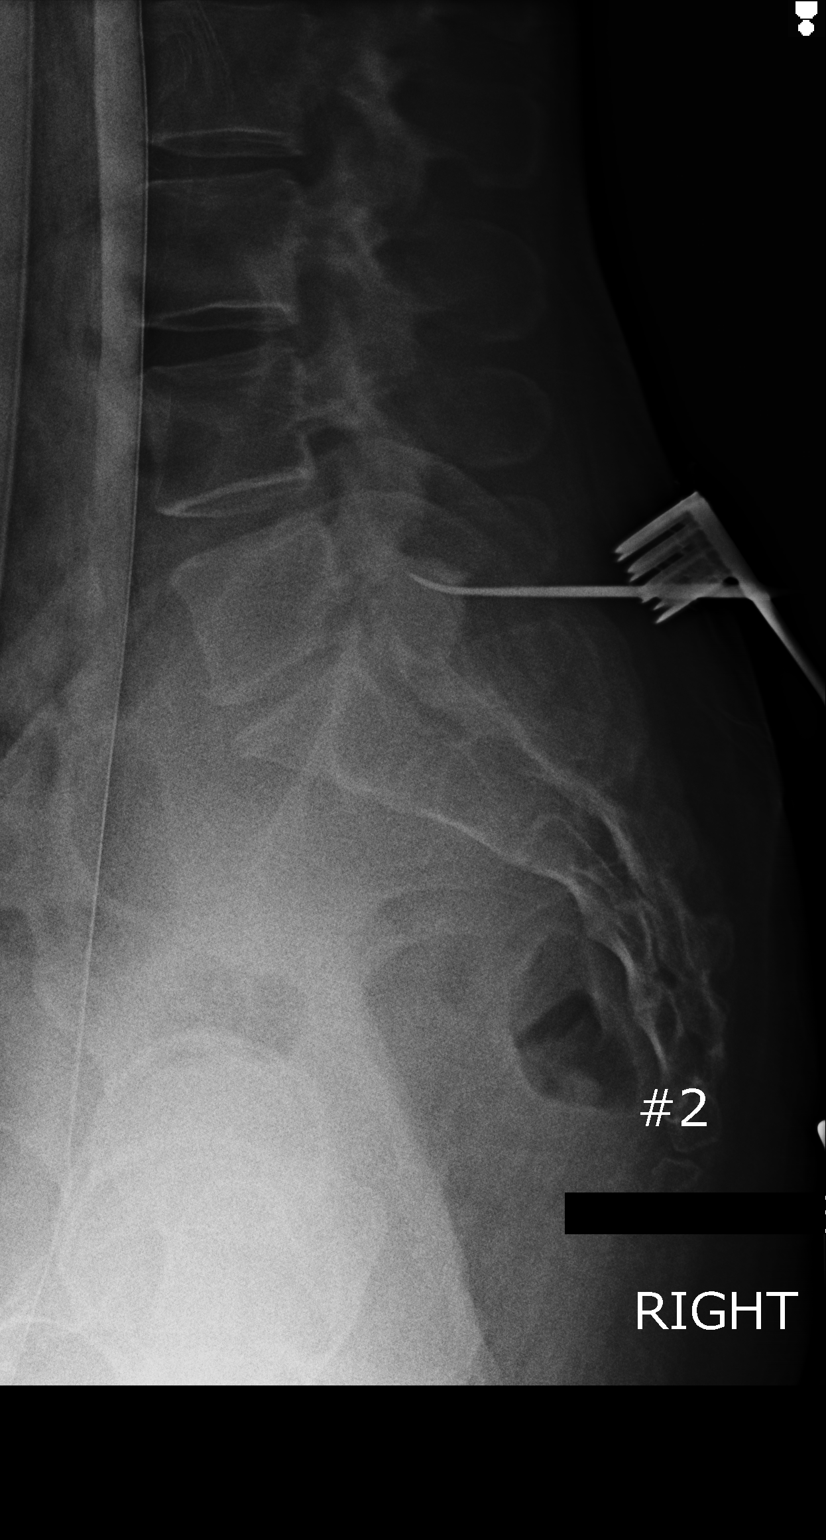
[im 2/2]
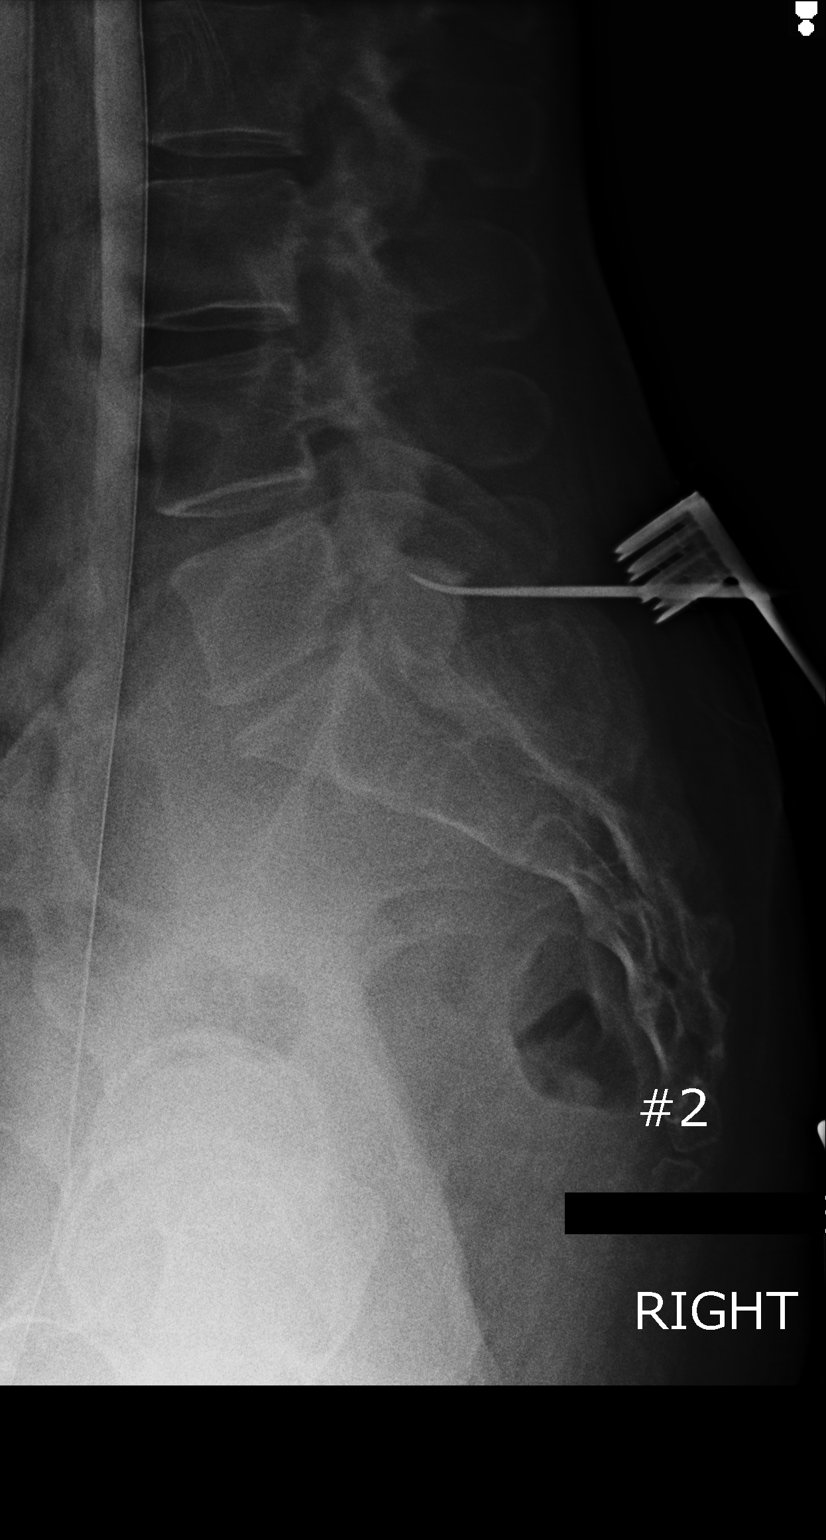

[4 of 4 positions shown; findings below may reference images not displayed]

FINDINGS: Final image demonstrates soft tissue retractors dorsal to L5-S1 with
probe it in the interspinous space at L5-S1.
IMPRESSION: Intraoperative localization at L5-S1.

## 2016-07-30 IMAGING — CR DG LUMBAR SPINE 1V
1 series · 1 of 1 positions shown · non-contrast
Comparison: 10/14/2014 myelography

CLINICAL DATA: Intraoperative localization, L5-S1 micro diskectomy

EXAM:
LUMBAR SPINE - 1 VIEW

[xtable lateral]
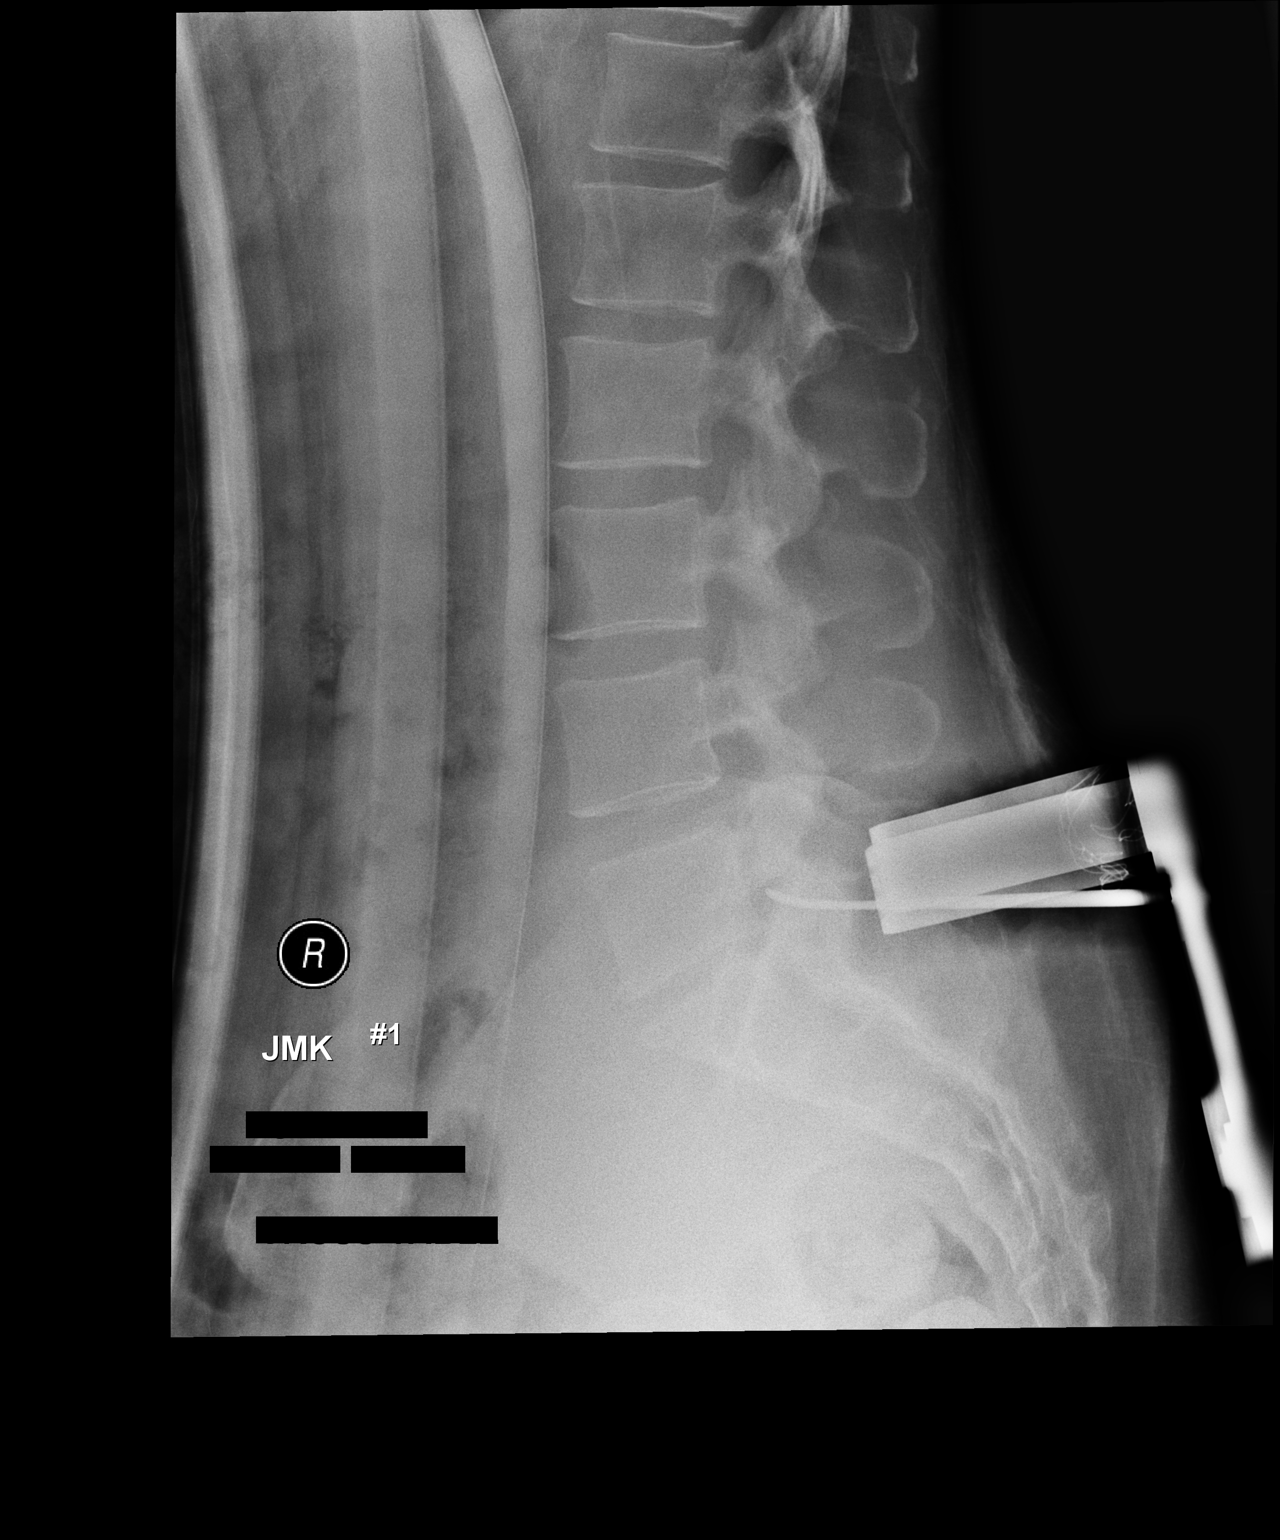

[1 of 1 positions shown; findings below may reference images not displayed]

FINDINGS: 5 non rib-bearing lumbar type vertebral bodies are identified. A
single intraoperative image obtained at [DATE] a.m. demonstrates
radiopaque marker posterior to the L5 vertebral body over the neural
foramen.
IMPRESSION: Intraoperative localization as above.

## 2017-01-22 IMAGING — RF DG LUMBAR SPINE 2-3V
1 series · 2 of 2 positions shown · non-contrast
Comparison: MRI lumbar spine 03/18/2015

FLUOROSCOPY TIME:  1 minutes 1 second

Images obtained: 2

CLINICAL DATA: L5-S1 PLIF

EXAM:
LUMBAR SPINE - 2-3 VIEW; DG C-ARM 61-120 MIN

[Series 1: run · 2 of 2 slices shown]
[im 1/2]
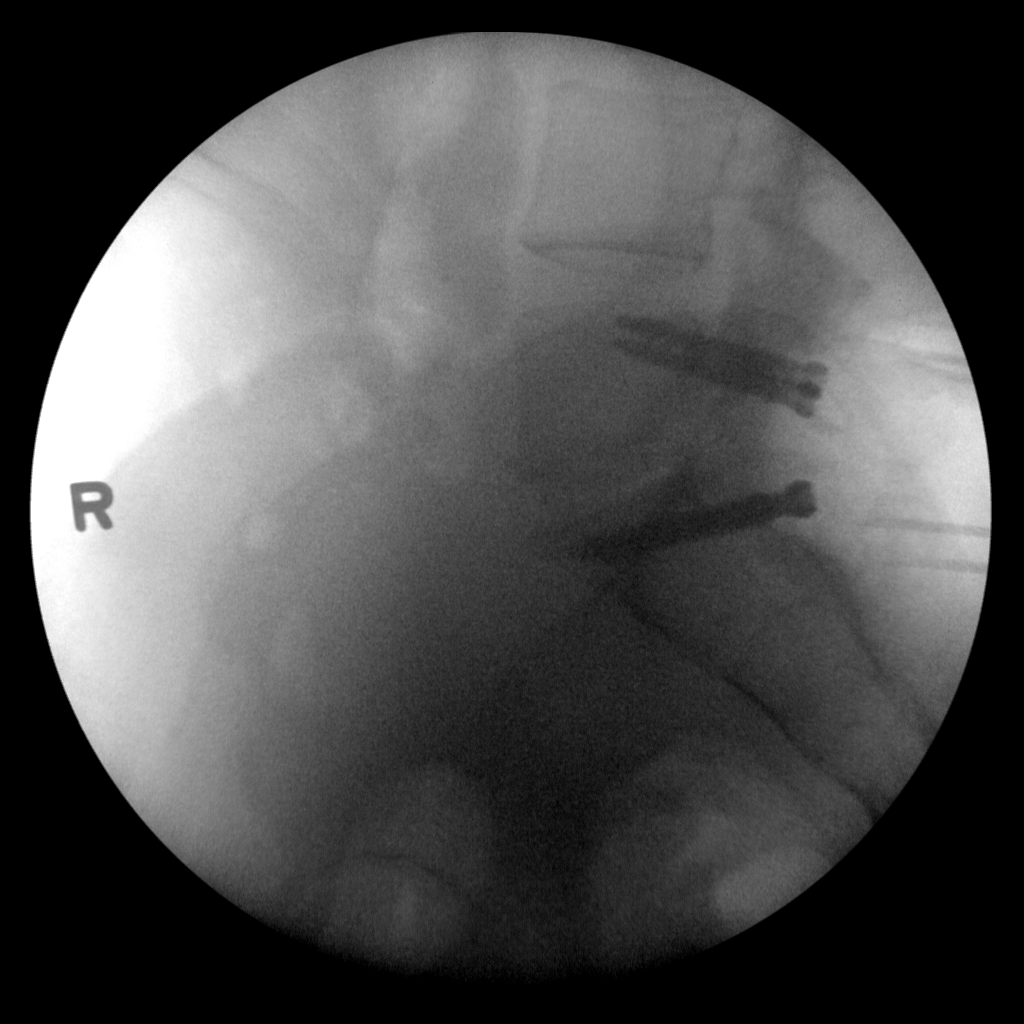
[im 2/2]
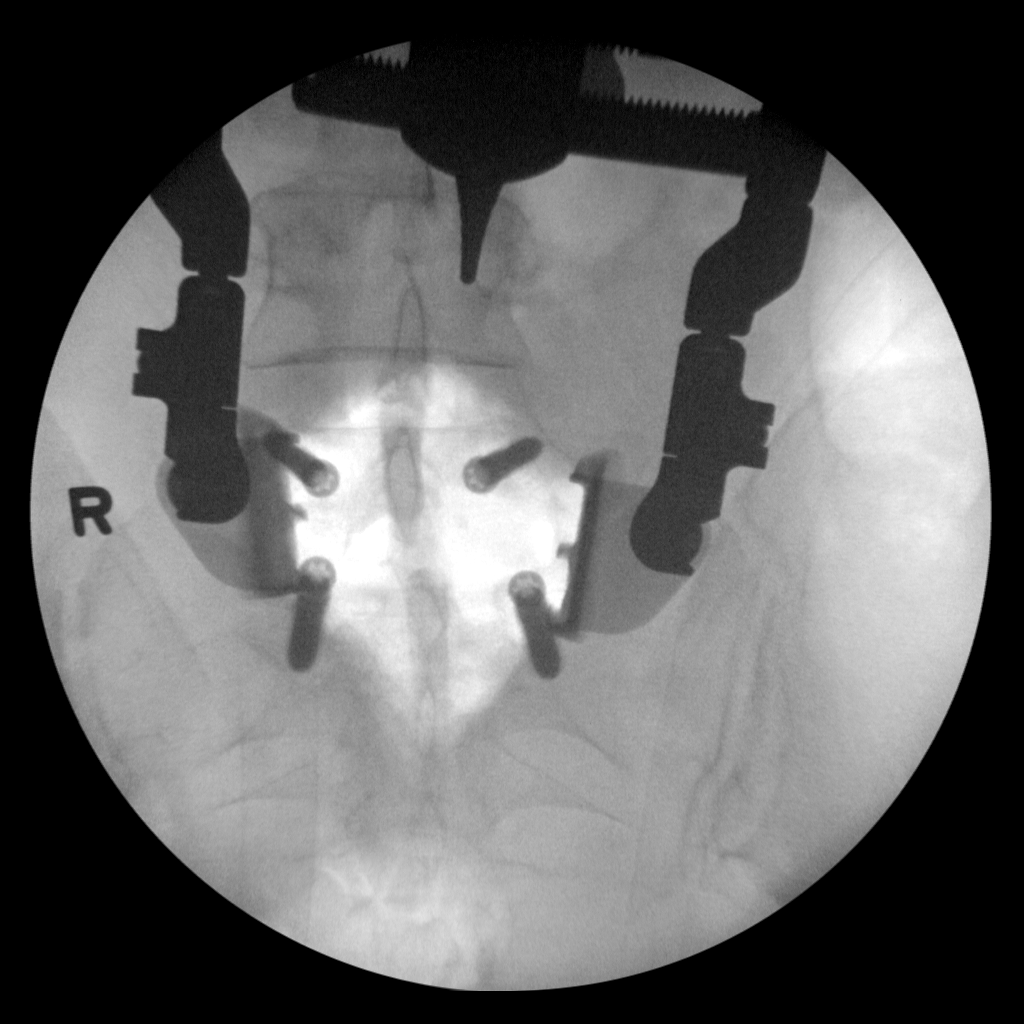

[2 of 2 positions shown; findings below may reference images not displayed]

FINDINGS: AP and lateral images.

Five lumbar vertebra labeled on prior MR.

BILATERAL pedicle screws have been placed at L5 and S1.

Disc space narrowing L5-S1

Bones appear demineralized.
IMPRESSION: Placement of BILATERAL pedicle screws at L5 and S1.

## 2017-06-14 ENCOUNTER — Encounter (INDEPENDENT_AMBULATORY_CARE_PROVIDER_SITE_OTHER): Payer: Self-pay | Admitting: Ophthalmology

## 2017-06-17 ENCOUNTER — Encounter (INDEPENDENT_AMBULATORY_CARE_PROVIDER_SITE_OTHER): Payer: Commercial Managed Care - PPO | Admitting: Ophthalmology

## 2017-06-17 DIAGNOSIS — H2511 Age-related nuclear cataract, right eye: Secondary | ICD-10-CM | POA: Diagnosis not present

## 2017-06-17 DIAGNOSIS — H538 Other visual disturbances: Secondary | ICD-10-CM | POA: Diagnosis not present

## 2017-06-17 DIAGNOSIS — H43813 Vitreous degeneration, bilateral: Secondary | ICD-10-CM | POA: Diagnosis not present

## 2017-07-17 ENCOUNTER — Encounter (HOSPITAL_COMMUNITY): Payer: Self-pay | Admitting: *Deleted

## 2017-07-18 ENCOUNTER — Other Ambulatory Visit: Payer: Self-pay

## 2017-07-18 ENCOUNTER — Encounter (HOSPITAL_COMMUNITY): Payer: Self-pay

## 2017-07-18 ENCOUNTER — Encounter (HOSPITAL_COMMUNITY)
Admission: RE | Admit: 2017-07-18 | Discharge: 2017-07-18 | Disposition: A | Discharge status: Home / Self Care | Payer: Commercial Managed Care - PPO | Source: Ambulatory Visit | Attending: Obstetrics and Gynecology | Admitting: Obstetrics and Gynecology

## 2017-07-18 DIAGNOSIS — Z01812 Encounter for preprocedural laboratory examination: Secondary | ICD-10-CM | POA: Diagnosis not present

## 2017-07-18 HISTORY — DX: Hypothyroidism, unspecified: E03.9

## 2017-07-18 LAB — CBC
HEMATOCRIT: 36.7 % (ref 36.0–46.0)
Hemoglobin: 12 g/dL (ref 12.0–15.0)
MCH: 28.6 pg (ref 26.0–34.0)
MCHC: 32.7 g/dL (ref 30.0–36.0)
MCV: 87.6 fL (ref 78.0–100.0)
PLATELETS: 195 10*3/uL (ref 150–400)
RBC: 4.19 MIL/uL (ref 3.87–5.11)
RDW: 14.7 % (ref 11.5–15.5)
WBC: 8.2 10*3/uL (ref 4.0–10.5)

## 2017-07-18 NOTE — Patient Instructions (Addendum)
Your procedure is scheduled on:  Tuesday, May 7 at 7:30 am  Enter through the [facility] at: 6 am  Pick up the phone at the desk and dial [phone].  Call this number if you have problems the morning of surgery: [phone].  Remember: Do NOT eat food or Do NOT drink clear liquids (including water) after midnight Monday.  Take these medicines the morning of surgery with a SIP OF WATER: celexa, nature throd.  Yasmin if needed.  Ok to use eye drops.  Bring inhaler with you on day of surgery.  Do Not smoke on the day of surgery.  Stop herbal medications, vitamin supplements and ibuprofen at this time.  Do NOT wear jewelry (body piercing), metal hair clips/bobby pins, make-up, or nail polish. Do NOT wear lotions, powders, or perfumes.  You may wear deoderant. Do NOT shave for 48 hours prior to surgery. Do NOT bring valuables to the hospital. Contacts, dentures, or bridgework may not be worn into surgery.  Leave suitcase in car.  After surgery it may be brought to your room.  For patients admitted to the hospital, checkout time is 11:00 AM the day of discharge. Have a responsible adult drive you home and stay with you for 24 hours after your procedure.

## 2017-07-22 NOTE — H&P (Signed)
Stacy Villanueva is an 47 y.o. female. She was seen for her annual gyn exam in January, regular menses monthly with OCP, no problems.  Since then she has had frequent irregular bleeding.  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  Pertinent Gynecological History: Last mammogram: normal Date: 03/2017 Last pap: normal Date: 10/2014 OB History: G3, P2011   Menstrual History: No LMP recorded.    Past Medical History:  Diagnosis Date  . Anxiety   . Back pain   . Family history of adverse reaction to anesthesia    daughter is slow to wake up, slight nausea  . GERD (gastroesophageal reflux disease)    10/19/14- not currently  . Hypothyroidism     Past Surgical History:  Procedure Laterality Date  . LUMBAR LAMINECTOMY/DECOMPRESSION MICRODISCECTOMY Bilateral 08/18/2014   Procedure: LUMBAR FIVE-SACRAL ONE LUMBAR LAMINECTOMY/DECOMPRESSION MICRODISCECTOMY 1 LEVEL;  Surgeon: Ashok Pall, MD;  Location: [place] [facility];  Service: Neurosurgery;  Laterality: Bilateral;  Bilat L5S1 microdiskectomy  . LUMBAR LAMINECTOMY/DECOMPRESSION MICRODISCECTOMY Bilateral 10/20/2014   Procedure: Bilateral Fifth Lumbar Vertebra First Sacral Vertebra Microdiskectomy;  Surgeon: Ashok Pall, MD;  Location: [place] [facility];  Service: Neurosurgery;  Laterality: Bilateral;  Bilateral L5S1 microdiskectomy    Family History  Problem Relation Age of Onset  . Hypertension Mother   . Hypertension Father     Social History:  reports that she has never smoked. She has never used smokeless tobacco. She reports that she does not drink alcohol or use drugs.  Allergies:  Allergies  Allergen Reactions  . Meloxicam Other (See Comments)    Severe headache  . Penicillins Hives, Rash and Other (See Comments)    Has patient had a PCN reaction causing immediate rash, facial/tongue/throat swelling, SOB or lightheadedness with hypotension: Yes Has patient had a PCN reaction causing severe rash  involving mucus membranes or skin necrosis: No Has patient had a PCN reaction that required hospitalization No Has patient had a PCN reaction occurring within the last 10 years: No If all of the above answers are "NO", then may proceed with Cephalosporin use.   . Dilaudid [Hydromorphone Hcl] Other (See Comments)    Out of body experience  . Latex Itching and Rash    No medications prior to admission.    Review of Systems  Respiratory: Negative.   Cardiovascular: Negative.     There were no vitals taken for this visit. Physical Exam  Constitutional: She appears well-developed and well-nourished.  Neck: Neck supple. No thyromegaly present.  Cardiovascular: Normal rate, regular rhythm and normal heart sounds.  No murmur heard. Respiratory: Effort normal and breath sounds normal. No respiratory distress. She has no wheezes.  GI: Soft. She exhibits no distension and no mass. There is no tenderness.  Genitourinary: Vagina normal and uterus normal.  Genitourinary Comments: No adnexal mass    No results found for this or any previous visit (from the past 24 hour(s)).  No results found.  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  Surgical procedure, risks, alternatives, chances of relieving symptoms have all been discussed.  Will admit for hysteroscopy with Myosure resection of the myoma.  Blane Ohara Isaiha Asare 07/22/2017, 4:33 PM

## 2017-07-23 ENCOUNTER — Ambulatory Visit (HOSPITAL_COMMUNITY): Payer: Commercial Managed Care - PPO | Admitting: Certified Registered Nurse Anesthetist

## 2017-07-23 ENCOUNTER — Ambulatory Visit (HOSPITAL_COMMUNITY)
Admission: RE | Admit: 2017-07-23 | Discharge: 2017-07-23 | Disposition: A | Discharge status: Home / Self Care | Payer: Commercial Managed Care - PPO | Source: Ambulatory Visit | Attending: Obstetrics and Gynecology | Admitting: Obstetrics and Gynecology

## 2017-07-23 ENCOUNTER — Other Ambulatory Visit: Payer: Self-pay

## 2017-07-23 ENCOUNTER — Encounter (HOSPITAL_COMMUNITY)
Admission: RE | Disposition: A | Discharge status: Home / Self Care | Payer: Self-pay | Source: Ambulatory Visit | Attending: Obstetrics and Gynecology

## 2017-07-23 ENCOUNTER — Encounter (HOSPITAL_COMMUNITY): Payer: Self-pay | Admitting: *Deleted

## 2017-07-23 DIAGNOSIS — Z79899 Other long term (current) drug therapy: Secondary | ICD-10-CM | POA: Insufficient documentation

## 2017-07-23 DIAGNOSIS — E039 Hypothyroidism, unspecified: Secondary | ICD-10-CM | POA: Insufficient documentation

## 2017-07-23 DIAGNOSIS — K219 Gastro-esophageal reflux disease without esophagitis: Secondary | ICD-10-CM | POA: Diagnosis not present

## 2017-07-23 DIAGNOSIS — F419 Anxiety disorder, unspecified: Secondary | ICD-10-CM | POA: Diagnosis not present

## 2017-07-23 DIAGNOSIS — D250 Submucous leiomyoma of uterus: Secondary | ICD-10-CM | POA: Diagnosis present

## 2017-07-23 HISTORY — PX: DILATATION & CURETTAGE/HYSTEROSCOPY WITH MYOSURE: SHX6511

## 2017-07-23 LAB — PREGNANCY, URINE: PREG TEST UR: NEGATIVE

## 2017-07-23 SURGERY — DILATATION & CURETTAGE/HYSTEROSCOPY WITH MYOSURE
Anesthesia: General | Site: Vagina

## 2017-07-23 MED ORDER — SUGAMMADEX SODIUM 200 MG/2ML IV SOLN
INTRAVENOUS | Status: AC
  Filled 2017-07-23: qty 2

## 2017-07-23 MED ORDER — SCOPOLAMINE 1 MG/3DAYS TD PT72
1.0000 | MEDICATED_PATCH | Freq: Once | TRANSDERMAL | Status: DC
  Administered 2017-07-23: 1.5 mg via TRANSDERMAL

## 2017-07-23 MED ORDER — MIDAZOLAM HCL 2 MG/2ML IJ SOLN
INTRAMUSCULAR | Status: DC | PRN
  Administered 2017-07-23: 2 mg via INTRAVENOUS

## 2017-07-23 MED ORDER — ONDANSETRON HCL 4 MG/2ML IJ SOLN
INTRAMUSCULAR | Status: AC
  Filled 2017-07-23: qty 2

## 2017-07-23 MED ORDER — MIDAZOLAM HCL 2 MG/2ML IJ SOLN: 0.5000 mg | Freq: Once | INTRAMUSCULAR | Status: DC | PRN

## 2017-07-23 MED ORDER — KETOROLAC TROMETHAMINE 30 MG/ML IJ SOLN
INTRAMUSCULAR | Status: AC
  Filled 2017-07-23: qty 1

## 2017-07-23 MED ORDER — PROPOFOL 10 MG/ML IV BOLUS
INTRAVENOUS | Status: AC
  Filled 2017-07-23: qty 20

## 2017-07-23 MED ORDER — PROMETHAZINE HCL 25 MG/ML IJ SOLN: 6.2500 mg | INTRAMUSCULAR | Status: DC | PRN

## 2017-07-23 MED ORDER — FENTANYL CITRATE (PF) 100 MCG/2ML IJ SOLN
INTRAMUSCULAR | Status: AC
  Filled 2017-07-23: qty 2

## 2017-07-23 MED ORDER — FENTANYL CITRATE (PF) 100 MCG/2ML IJ SOLN
INTRAMUSCULAR | Status: AC
  Administered 2017-07-23: 50 ug via INTRAVENOUS
  Filled 2017-07-23: qty 2

## 2017-07-23 MED ORDER — FENTANYL CITRATE (PF) 100 MCG/2ML IJ SOLN
INTRAMUSCULAR | Status: DC | PRN
  Administered 2017-07-23 (×2): 50 ug via INTRAVENOUS

## 2017-07-23 MED ORDER — GLYCOPYRROLATE 0.2 MG/ML IJ SOLN
INTRAMUSCULAR | Status: DC | PRN
  Administered 2017-07-23: .2 mg via INTRAVENOUS

## 2017-07-23 MED ORDER — SUGAMMADEX SODIUM 200 MG/2ML IV SOLN
INTRAVENOUS | Status: DC | PRN
  Administered 2017-07-23: 200 mg via INTRAVENOUS

## 2017-07-23 MED ORDER — DEXAMETHASONE SODIUM PHOSPHATE 10 MG/ML IJ SOLN
INTRAMUSCULAR | Status: DC | PRN
  Administered 2017-07-23: 10 mg via INTRAVENOUS

## 2017-07-23 MED ORDER — LACTATED RINGERS IV SOLN
INTRAVENOUS | Status: DC
  Administered 2017-07-23 (×2): via INTRAVENOUS

## 2017-07-23 MED ORDER — ROCURONIUM BROMIDE 100 MG/10ML IV SOLN
INTRAVENOUS | Status: DC | PRN
  Administered 2017-07-23: 20 mg via INTRAVENOUS

## 2017-07-23 MED ORDER — MIDAZOLAM HCL 2 MG/2ML IJ SOLN
INTRAMUSCULAR | Status: AC
  Filled 2017-07-23: qty 2

## 2017-07-23 MED ORDER — ONDANSETRON HCL 4 MG/2ML IJ SOLN
INTRAMUSCULAR | Status: DC | PRN
  Administered 2017-07-23: 4 mg via INTRAVENOUS

## 2017-07-23 MED ORDER — LIDOCAINE HCL 2 % IJ SOLN
INTRAMUSCULAR | Status: DC | PRN
  Administered 2017-07-23: 16 mL

## 2017-07-23 MED ORDER — SODIUM CHLORIDE 0.9 % IR SOLN
Status: DC | PRN
  Administered 2017-07-23: 3000 mL

## 2017-07-23 MED ORDER — DEXAMETHASONE SODIUM PHOSPHATE 10 MG/ML IJ SOLN
INTRAMUSCULAR | Status: AC
  Filled 2017-07-23: qty 1

## 2017-07-23 MED ORDER — LIDOCAINE HCL (CARDIAC) PF 100 MG/5ML IV SOSY
PREFILLED_SYRINGE | INTRAVENOUS | Status: AC
  Filled 2017-07-23: qty 5

## 2017-07-23 MED ORDER — MEPERIDINE HCL 25 MG/ML IJ SOLN: 6.2500 mg | INTRAMUSCULAR | Status: DC | PRN

## 2017-07-23 MED ORDER — GLYCOPYRROLATE 0.2 MG/ML IJ SOLN
INTRAMUSCULAR | Status: AC
  Filled 2017-07-23: qty 1

## 2017-07-23 MED ORDER — LACTATED RINGERS IV SOLN: INTRAVENOUS | Status: DC

## 2017-07-23 MED ORDER — SUCCINYLCHOLINE CHLORIDE 20 MG/ML IJ SOLN
INTRAMUSCULAR | Status: DC | PRN
  Administered 2017-07-23: 120 mg via INTRAVENOUS

## 2017-07-23 MED ORDER — LIDOCAINE HCL (CARDIAC) PF 100 MG/5ML IV SOSY
PREFILLED_SYRINGE | INTRAVENOUS | Status: DC | PRN
  Administered 2017-07-23: 40 mg via INTRAVENOUS

## 2017-07-23 MED ORDER — KETOROLAC TROMETHAMINE 30 MG/ML IJ SOLN
INTRAMUSCULAR | Status: DC | PRN
  Administered 2017-07-23: 30 mg via INTRAVENOUS

## 2017-07-23 MED ORDER — LIDOCAINE HCL 2 % IJ SOLN
INTRAMUSCULAR | Status: AC
  Filled 2017-07-23: qty 20

## 2017-07-23 MED ORDER — PROPOFOL 10 MG/ML IV BOLUS
INTRAVENOUS | Status: DC | PRN
  Administered 2017-07-23: 200 mg via INTRAVENOUS

## 2017-07-23 MED ORDER — FENTANYL CITRATE (PF) 100 MCG/2ML IJ SOLN
25.0000 ug | INTRAMUSCULAR | Status: DC | PRN
  Administered 2017-07-23: 50 ug via INTRAVENOUS

## 2017-07-23 MED ORDER — SCOPOLAMINE 1 MG/3DAYS TD PT72
MEDICATED_PATCH | TRANSDERMAL | Status: AC
  Filled 2017-07-23: qty 1

## 2017-07-23 SURGICAL SUPPLY — 16 items
CATH ROBINSON RED A/P 16FR (CATHETERS) ×3 IMPLANT
DEVICE MYOSURE LITE (MISCELLANEOUS) IMPLANT
DEVICE MYOSURE REACH (MISCELLANEOUS) ×2 IMPLANT
FILTER ARTHROSCOPY CONVERTOR (FILTER) ×3 IMPLANT
GLOVE BIOGEL PI IND STRL 7.0 (GLOVE) ×1 IMPLANT
GLOVE BIOGEL PI INDICATOR 7.0 (GLOVE) ×2
GLOVE ORTHO TXT STRL SZ7.5 (GLOVE) ×6 IMPLANT
GOWN STRL REUS W/TWL LRG LVL3 (GOWN DISPOSABLE) ×6 IMPLANT
PACK VAGINAL MINOR WOMEN LF (CUSTOM PROCEDURE TRAY) ×3 IMPLANT
PAD OB MATERNITY 4.3X12.25 (PERSONAL CARE ITEMS) ×3 IMPLANT
SEAL ROD LENS SCOPE MYOSURE (ABLATOR) ×3 IMPLANT
SUT VIC AB 3-0 SH 27 (SUTURE) ×3
SUT VIC AB 3-0 SH 27X BRD (SUTURE) IMPLANT
TOWEL OR 17X24 6PK STRL BLUE (TOWEL DISPOSABLE) ×6 IMPLANT
TUBING AQUILEX INFLOW (TUBING) ×3 IMPLANT
TUBING AQUILEX OUTFLOW (TUBING) ×3 IMPLANT

## 2017-07-23 NOTE — Transfer of Care (Signed)
Immediate Anesthesia Transfer of Care Note  Patient: [name]  Procedure(s) Performed: DILATATION & CURETTAGE/HYSTEROSCOPY WITH MYOSURE RESECTION OF ENDOMETRIAL LESION (N/A Vagina )  Patient Location: PACU  Anesthesia Type:General  Level of Consciousness: awake, alert  and oriented  Airway & Oxygen Therapy: Patient Spontanous Breathing and Patient connected to nasal cannula oxygen  Post-op Assessment: Report given to RN, Post -op Vital signs reviewed and stable and Patient moving all extremities X 4  Post vital signs: Reviewed and stable  Last Vitals:  Vitals Value Taken Time  BP    Temp    Pulse    Resp    SpO2      Last Pain:  Vitals:   07/23/17 0613  TempSrc: Oral  PainSc: 3       Patients Stated Pain Goal: 2 (08/65/78 4696)  Complications: No apparent anesthesia complications

## 2017-07-23 NOTE — Interval H&P Note (Signed)
History and Physical Interval Note:  07/23/2017 7:09 AM  Stacy Villanueva  has presented today for surgery, with the diagnosis of Abnormal uterine bleeding with submucosal myoma  The various methods of treatment have been discussed with the patient and family. After consideration of risks, benefits and other options for treatment, the patient has consented to  Procedure(s): [place] (N/A) as a surgical intervention .  The patient's history has been reviewed, patient examined, no change in status, stable for surgery.  I have reviewed the patient's chart and labs.  Questions were answered to the patient's satisfaction.     [name]

## 2017-07-23 NOTE — Anesthesia Procedure Notes (Signed)
Procedure Name: Intubation Date/Time: 07/23/2017 7:28 AM Performed by: Annye Asa, MD Pre-anesthesia Checklist: Patient identified, Patient being monitored, Timeout performed, Emergency Drugs available and Suction available Patient Re-evaluated:Patient Re-evaluated prior to induction Oxygen Delivery Method: Circle System Utilized Preoxygenation: Pre-oxygenation with 100% oxygen Induction Type: IV induction Ventilation: Mask ventilation without difficulty Laryngoscope Size: Miller and 2 Grade View: Grade II Tube type: Oral Tube size: 7.0 mm Number of attempts: 1 Airway Equipment and Method: stylet Placement Confirmation: ETT inserted through vocal cords under direct vision,  positive ETCO2 and breath sounds checked- equal and bilateral Secured at: 21 cm Tube secured with: Tape Dental Injury: Teeth and Oropharynx as per pre-operative assessment

## 2017-07-23 NOTE — Anesthesia Preprocedure Evaluation (Signed)
Anesthesia Evaluation  Patient identified by MRN, date of birth, ID band Patient awake    Reviewed: Allergy & Precautions, NPO status , Patient's Chart, lab work & pertinent test results  History of Anesthesia Complications Negative for: history of anesthetic complications  Airway Mallampati: II  TM Distance: >3 FB Neck ROM: Full    Dental  (+) Dental Advisory Given   Pulmonary neg pulmonary ROS,    breath sounds clear to auscultation       Cardiovascular negative cardio ROS   Rhythm:Regular Rate:Normal     Neuro/Psych Anxiety negative neurological ROS     GI/Hepatic Neg liver ROS, GERD  Poorly Controlled,  Endo/Other  Hypothyroidism Morbid obesity  Renal/GU negative Renal ROS     Musculoskeletal   Abdominal (+) + obese,   Peds  Hematology negative hematology ROS (+)   Anesthesia Other Findings   Reproductive/Obstetrics                             Anesthesia Physical Anesthesia Plan  ASA: II  Anesthesia Plan: General   Post-op Pain Management:    Induction: Intravenous  PONV Risk Score and Plan: 3 and Ondansetron, Dexamethasone and Scopolamine patch - Pre-op  Airway Management Planned: Oral ETT  Additional Equipment:   Intra-op Plan:   Post-operative Plan: Extubation in OR  Informed Consent: I have reviewed the patients History and Physical, chart, labs and discussed the procedure including the risks, benefits and alternatives for the proposed anesthesia with the patient or authorized representative who has indicated his/her understanding and acceptance.   Dental advisory given  Plan Discussed with: CRNA and Surgeon  Anesthesia Plan Comments: (Plan routine monitors, GETA (symptomatic GERD this am))        Anesthesia Quick Evaluation

## 2017-07-23 NOTE — Op Note (Signed)
Preoperative diagnosis: AUB, submucosal myoma Postoperative diagnosis: Same Procedure: Hysteroscopy with Myosure resection of myoma Surgeon: Cheri Fowler M.D. Anesthesia: Gen. With an LMA, paracervical block Findings: She had a normal endometrial cavity with a 2-3 cm posterior submucosal myoma, normal endometrial tissue Estimated blood loss: Minimal Fluid deficit: Through the hysteroscope fluid deficit was about 300 cc Specimens: Endometrial resection sent for routine pathology Complications: None  Procedure in detail: The patient was taken to the operating room and placed in the dorsosupine position. General anesthesia was induced. She was placed in mobile stirrups and legs were elevated. Perineum and vagina were prepped and draped in the usual sterile fashion and bladder drained with a red Robinson catheter. A Graves speculum was inserted in the vagina and the anterior lip of the cervix was grasped with a single-tooth tenaculum. Paracervical block was performed with a total of 16 cc of 2% plain lidocaine. The uterus then sounded to 8 cm. The cervix was gradually dilated to a size 23 dilator without difficulty. The Myosure hysteroscope was inserted and good visualization was achieved.  The submucossal myoma was easily identified.  The Myosure Reach was inserted and the myoma was completely resected, as well as some small pieces of endometrial tissue.  The endometrial cavity was now normal, no other visible lesions.  The hysteroscope was removed. Sharp curettage was performed to remove any remaining endometrial tissue.  The single-tooth tenaculum was removed from the cervix and bleeding was eventually controlled with a figure 8 suture of 3-0 Vicryl after pressure and silver nitrate failed to achieve hemostasis. All instruments were then removed from the vagina. The patient tolerated the procedure well and was taken to the recovery in stable condition. Counts were correct and she had PAS hose on  throughout the procedure.

## 2017-07-23 NOTE — Anesthesia Postprocedure Evaluation (Signed)
Anesthesia Post Note  Patient: Stacy Villanueva  Procedure(s) Performed: DILATATION & CURETTAGE/HYSTEROSCOPY WITH MYOSURE RESECTION OF ENDOMETRIAL LESION (N/A Vagina )     Patient location during evaluation: PACU Anesthesia Type: General Level of consciousness: awake and alert, oriented and patient cooperative Pain management: pain level controlled Vital Signs Assessment: post-procedure vital signs reviewed and stable Respiratory status: spontaneous breathing, nonlabored ventilation and respiratory function stable Cardiovascular status: blood pressure returned to baseline and stable Postop Assessment: no apparent nausea or vomiting Anesthetic complications: no    Last Vitals:  Vitals:   07/23/17 0900 07/23/17 0922  BP: 124/75 126/79  Pulse: 89 82  Resp: 17 20  Temp: 36.7 C   SpO2: 99% 100%    Last Pain:  Vitals:   07/23/17 0922  TempSrc:   PainSc: 2    Pain Goal: Patients Stated Pain Goal: 2 (07/23/17 6387)               Seleta Rhymes. Eletha Culbertson

## 2017-07-23 NOTE — Discharge Instructions (Signed)
Routine instructions for hysteroscopy   DISCHARGE INSTRUCTIONS: HYSTEROSCOPY  The following instructions have been prepared to help you care for yourself upon your return home.  May Remove Scop patch on or before 07/26/17.  May take Ibuprofen after 1:50pm today.  May take stool softner while taking narcotic pain medication to prevent constipation.  Drink plenty of water.  Personal hygiene:  Use sanitary pads for vaginal drainage, not tampons.  Shower the day after your procedure.  NO tub baths, pools or Jacuzzis for 2-3 weeks.  Wipe front to back after using the bathroom.  Activity and limitations:  Do NOT drive or operate any equipment for 24 hours. The effects of anesthesia are still present and drowsiness may result.  Do NOT rest in bed all day.  Walking is encouraged.  Walk up and down stairs slowly.  You may resume your normal activity in one to two days or as indicated by your physician. Sexual activity: NO intercourse for at least 2 weeks after the procedure, or as indicated by your Doctor.  Diet: Eat a light meal as desired this evening. You may resume your usual diet tomorrow.  Return to Work: You may resume your work activities in one to two days or as indicated by Marine scientist.  What to expect after your surgery: Expect to have vaginal bleeding/discharge for 2-3 days and spotting for up to 10 days. It is not unusual to have soreness for up to 1-2 weeks. You may have a slight burning sensation when you urinate for the first day. Mild cramps may continue for a couple of days. You may have a regular period in 2-6 weeks.  Call your doctor for any of the following:  Excessive vaginal bleeding or clotting, saturating and changing one pad every hour.  Inability to urinate 6 hours after discharge from hospital.  Pain not relieved by pain medication.  Fever of 100.4 F or greater.  Unusual vaginal discharge or odor.  Return to office _________________Call  for an appointment ___________________ Patients signature: ______________________ Nurses signature ________________________  Post Anesthesia Care Unit [phone]     Post Anesthesia Home Care Instructions  Activity: Get plenty of rest for the remainder of the day. A responsible individual must stay with you for 24 hours following the procedure.  For the next 24 hours, DO NOT: -Drive a car -Paediatric nurse -Drink alcoholic beverages -Take any medication unless instructed by your physician -Make any legal decisions or sign important papers.  Meals: Start with liquid foods such as gelatin or soup. Progress to regular foods as tolerated. Avoid greasy, spicy, heavy foods. If nausea and/or vomiting occur, drink only clear liquids until the nausea and/or vomiting subsides. Call your physician if vomiting continues.  Special Instructions/Symptoms: Your throat may feel dry or sore from the anesthesia or the breathing tube placed in your throat during surgery. If this causes discomfort, gargle with warm salt water. The discomfort should disappear within 24 hours.  If you had a scopolamine patch placed behind your ear for the management of post- operative nausea and/or vomiting:  1. The medication in the patch is effective for 72 hours, after which it should be removed.  Wrap patch in a tissue and discard in the trash. Wash hands thoroughly with soap and water. 2. You may remove the patch earlier than 72 hours if you experience unpleasant side effects which may include dry mouth, dizziness or visual disturbances. 3. Avoid touching the patch. Wash your hands with soap and water after contact  patch. °  ° ° °

## 2017-07-24 ENCOUNTER — Encounter (HOSPITAL_COMMUNITY): Payer: Self-pay | Admitting: Obstetrics and Gynecology

## 2018-01-28 NOTE — Patient Instructions (Signed)
Stacy Villanueva  01/28/2018      Mackinzee Roszak  01/28/2018      Your procedure is scheduled on   02-04-18  Report to [facility]  at      [facility]  Call this number if you have problems the morning of surgery:[phone]  OUR ADDRESS IS [address], WE ARE LOCATED IN THE [facility]ICAL PLA[facility] WITH [facility].   Remember:  Do not eat food or drink liquids after midnight.  Take these medicines the morning of surgery with A SIP OF WATER    Nature-throid, ativan if needed, celexa   Do not wear jewelry, make-up or nail polish.  Do not wear lotions, powders, or perfumes, or deoderant.  Do not shave 48 hours prior to surgery.  Do not bring valuables to the hospital.  [facility] is not responsible for any belongings or valuables.  Contacts, dentures or bridgework may not be worn into surgery.  Leave your suitcase in the car.  After surgery it may be brought to your room.  For patients admitted to the hospital, discharge time will be determined by your treatment team.  Patients discharged the day of surgery will not be allowed to drive home.   Special instructions:   Please read over the following fact sheets that you were given:     _____________________________________________________________________            [facility] - Preparing for Surgery Before surgery, you can play an important role.  Because skin is not sterile, your skin needs to be as free of germs as possible.  You can reduce the number of germs on your skin by washing with CHG (chlorahexidine gluconate) soap before surgery.  CHG is an antiseptic cleaner which kills germs and bonds with the skin to continue killing germs even after washing. Please DO NOT use if you have an allergy to CHG or antibacterial soaps.  If your skin becomes reddened/irritated stop using the CHG and inform your nurse when you arrive at Short Stay. Do not shave (including legs and underarms) for at least  48 hours prior to the first CHG shower.  You may shave your face/neck. Please follow these instructions carefully:  1.  Shower with CHG Soap the night before surgery and the  morning of Surgery.  2.  If you choose to wash your hair, wash your hair first as usual with your  normal  shampoo.  3.  After you shampoo, rinse your hair and body thoroughly to remove the  shampoo.                           4.  Use CHG as you would any other liquid soap.  You can apply chg directly  to the skin and wash                       Gently with a scrungie or clean washcloth.  5.  Apply the CHG Soap to your body ONLY FROM THE NECK DOWN.   Do not use on face/ open                           Wound or open sores. Avoid contact with eyes, ears mouth and genitals (private parts).  Wash face,  Genitals (private parts) with your normal soap.             6.  Wash thoroughly, paying special attention to the area where your surgery  will be performed.  7.  Thoroughly rinse your body with warm water from the neck down.  8.  DO NOT shower/wash with your normal soap after using and rinsing off  the CHG Soap.                9.  Pat yourself dry with a clean towel.            10.  Wear clean pajamas.            11.  Place clean sheets on your bed the night of your first shower and do not  sleep with pets. Day of Surgery : Do not apply any lotions/deodorants the morning of surgery.  Please wear clean clothes to the hospital/surgery center.  FAILURE TO FOLLOW THESE INSTRUCTIONS MAY RESULT IN THE CANCELLATION OF YOUR SURGERY PATIENT SIGNATURE_________________________________  NURSE SIGNATURE__________________________________  ________________________________________________________________________   Adam Phenix  An incentive spirometer is a tool that can help keep your lungs clear and active. This tool measures how well you are filling your lungs with each breath. Taking long deep breaths may  help reverse or decrease the chance of developing breathing (pulmonary) problems (especially infection) following:  A long period of time when you are unable to move or be active. BEFORE THE PROCEDURE   If the spirometer includes an indicator to show your best effort, your nurse or respiratory therapist will set it to a desired goal.  If possible, sit up straight or lean slightly forward. Try not to slouch.  Hold the incentive spirometer in an upright position. INSTRUCTIONS FOR USE  1. Sit on the edge of your bed if possible, or sit up as far as you can in bed or on a chair. 2. Hold the incentive spirometer in an upright position. 3. Breathe out normally. 4. Place the mouthpiece in your mouth and seal your lips tightly around it. 5. Breathe in slowly and as deeply as possible, raising the piston or the ball toward the top of the column. 6. Hold your breath for 3-5 seconds or for as long as possible. Allow the piston or ball to fall to the bottom of the column. 7. Remove the mouthpiece from your mouth and breathe out normally. 8. Rest for a few seconds and repeat Steps 1 through 7 at least 10 times every 1-2 hours when you are awake. Take your time and take a few normal breaths between deep breaths. 9. The spirometer may include an indicator to show your best effort. Use the indicator as a goal to work toward during each repetition. 10. After each set of 10 deep breaths, practice coughing to be sure your lungs are clear. If you have an incision (the cut made at the time of surgery), support your incision when coughing by placing a pillow or rolled up towels firmly against it. Once you are able to get out of bed, walk around indoors and cough well. You may stop using the incentive spirometer when instructed by your caregiver.  RISKS AND COMPLICATIONS  Take your time so you do not get dizzy or light-headed.  If you are in pain, you may need to take or ask for pain medication before doing  incentive spirometry. It is harder to take a deep breath if you are having  pain. AFTER USE  Rest and breathe slowly and easily.  It can be helpful to keep track of a log of your progress. Your caregiver can provide you with a simple table to help with this. If you are using the spirometer at home, follow these instructions: [place] IF:   You are having difficultly using the spirometer.  You have trouble using the spirometer as often as instructed.  Your pain medication is not giving enough relief while using the spirometer.  You develop fever of 100.5 F (38.1 C) or higher. SEEK IMMEDIATE MEDICAL CARE IF:   You cough up bloody sputum that had not been present before.  You develop fever of 102 F (38.9 C) or greater.  You develop worsening pain at or near the incision site. MAKE SURE YOU:   Understand these instructions.  Will watch your condition.  Will get help right away if you are not doing well or get worse. Document Released: 07/16/2006 Document Revised: 05/28/2011 Document Reviewed: 09/16/2006 [facility] Patient Information 2014 [place], [place].   ________________________________________________________________________

## 2018-01-29 ENCOUNTER — Encounter (HOSPITAL_COMMUNITY)
Admission: RE | Admit: 2018-01-29 | Discharge: 2018-01-29 | Disposition: A | Discharge status: Home / Self Care | Payer: Commercial Managed Care - PPO | Source: Ambulatory Visit | Attending: Obstetrics and Gynecology | Admitting: Obstetrics and Gynecology

## 2018-01-29 ENCOUNTER — Other Ambulatory Visit: Payer: Self-pay

## 2018-01-29 ENCOUNTER — Encounter (HOSPITAL_COMMUNITY): Payer: Self-pay

## 2018-01-29 DIAGNOSIS — Z01812 Encounter for preprocedural laboratory examination: Secondary | ICD-10-CM | POA: Diagnosis not present

## 2018-01-29 HISTORY — DX: Major depressive disorder, single episode, unspecified: F32.9

## 2018-01-29 HISTORY — DX: Depression, unspecified: F32.A

## 2018-01-29 LAB — CBC
HCT: 39.3 % (ref 36.0–46.0)
HEMOGLOBIN: 12.1 g/dL (ref 12.0–15.0)
MCH: 27.3 pg (ref 26.0–34.0)
MCHC: 30.8 g/dL (ref 30.0–36.0)
MCV: 88.7 fL (ref 80.0–100.0)
Platelets: 176 10*3/uL (ref 150–400)
RBC: 4.43 MIL/uL (ref 3.87–5.11)
RDW: 13.8 % (ref 11.5–15.5)
WBC: 6.6 10*3/uL (ref 4.0–10.5)
nRBC: 0 % (ref 0.0–0.2)

## 2018-02-03 MED ORDER — GENTAMICIN SULFATE 40 MG/ML IJ SOLN
INTRAVENOUS | Status: AC
  Administered 2018-02-04: 341 mg via INTRAVENOUS
  Filled 2018-02-03 (×2): qty 8.5

## 2018-02-03 NOTE — H&P (Signed)
Stacy Villanueva is an 47 y.o. female. 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  She has continued to have abnormal bleeding despite OCP, wants definitive management.  Also has increasing urine leak with strain, would like this addressed at surgery as well.    Pertinent Gynecological History: Last mammogram: normal Date: 03/2017 Last pap: normal Date: 2016 OB History: G3, P2011  SVD x 2-daughter died as an adult from drug overdose  Menstrual History: No LMP recorded.    Past Medical History:  Diagnosis Date  . Anxiety   . Back pain   . Depression   . Family history of adverse reaction to anesthesia    daughter is slow to wake up, slight nausea  . GERD (gastroesophageal reflux disease)    10/19/14- not currently  occasional  . Hypothyroidism   . SVD (spontaneous vaginal delivery)    x 2    Past Surgical History:  Procedure Laterality Date  . BACK SURGERY  03/2015   lumbar laminectomy x3  . DILATATION & CURETTAGE/HYSTEROSCOPY WITH MYOSURE N/A 07/23/2017   Procedure: DILATATION & CURETTAGE/HYSTEROSCOPY WITH MYOSURE RESECTION OF ENDOMETRIAL LESION;  Surgeon: Cheri Fowler, MD;  Location: [facility];  Service: Gynecology;  Laterality: N/A;  . EYE SURGERY Left    cataraacts removed w/lens implant  . LUMBAR LAMINECTOMY/DECOMPRESSION MICRODISCECTOMY Bilateral 08/18/2014   Procedure: LUMBAR FIVE-SACRAL ONE LUMBAR LAMINECTOMY/DECOMPRESSION MICRODISCECTOMY 1 LEVEL;  Surgeon: Ashok Pall, MD;  Location: [facility];  Service: Neurosurgery;  Laterality: Bilateral;  Bilat L5S1 microdiskectomy  . LUMBAR LAMINECTOMY/DECOMPRESSION MICRODISCECTOMY Bilateral 10/20/2014   Procedure: Bilateral Fifth Lumbar Vertebra First Sacral Vertebra Microdiskectomy;  Surgeon: Ashok Pall, MD;  Location: [facility];  Service: Neurosurgery;  Laterality: Bilateral;  Bilateral L5S1 microdiskectomy    Family History  Problem Relation  Age of Onset  . Hypertension Mother   . Hypertension Father     Social History:  reports that she has never smoked. She has never used smokeless tobacco. She reports that she does not drink alcohol or use drugs.  Allergies:  Allergies  Allergen Reactions  . Meloxicam Other (See Comments)    Severe headache  . Penicillins Hives, Rash and Other (See Comments)    Has patient had a PCN reaction causing immediate rash, facial/tongue/throat swelling, SOB or lightheadedness with hypotension: Yes Has patient had a PCN reaction causing severe rash involving mucus membranes or skin necrosis: No Has patient had a PCN reaction that required hospitalization No Has patient had a PCN reaction occurring within the last 10 years: No If all of the above answers are "NO", then may proceed with Cephalosporin use.   . Dilaudid [Hydromorphone Hcl] Other (See Comments)    Slept for 26 hours  . Latex Itching and Rash    No medications prior to admission.    Review of Systems  Respiratory: Negative.   Cardiovascular: Negative.     There were no vitals taken for this visit. Physical Exam  Constitutional: She appears well-developed and well-nourished.  Neck: Neck supple. No thyromegaly present.  Cardiovascular: Normal rate, regular rhythm and normal heart sounds.  No murmur heard. Respiratory: Effort normal and breath sounds normal. No respiratory distress. She has no wheezes.  GI: Soft. She exhibits no distension and no mass. There is no tenderness.  Genitourinary: Vagina normal and uterus normal.    No results found for this or any previous visit (from the past 24 hour(s)).  No  results found.  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  All medical and surgical options have been discussed.  Surgical procedure and risks, specific risks associated with mesh sling have all been discussed.  Will admit for TVH, bilateral salpingectomy, Solyx sling and  cystoscopy.  Blane Ohara Lorelei Heikkila 02/03/2018, 5:45 PM

## 2018-02-04 ENCOUNTER — Ambulatory Visit (HOSPITAL_BASED_OUTPATIENT_CLINIC_OR_DEPARTMENT_OTHER)
Admission: RE | Admit: 2018-02-04 | Discharge: 2018-02-04 | Disposition: A | Discharge status: Home / Self Care | Payer: Commercial Managed Care - PPO | Source: Ambulatory Visit | Attending: Obstetrics and Gynecology | Admitting: Obstetrics and Gynecology

## 2018-02-04 ENCOUNTER — Ambulatory Visit (HOSPITAL_BASED_OUTPATIENT_CLINIC_OR_DEPARTMENT_OTHER): Payer: Commercial Managed Care - PPO | Admitting: Anesthesiology

## 2018-02-04 ENCOUNTER — Encounter (HOSPITAL_COMMUNITY)
Admission: RE | Disposition: A | Discharge status: Home / Self Care | Payer: Self-pay | Source: Ambulatory Visit | Attending: Obstetrics and Gynecology

## 2018-02-04 ENCOUNTER — Encounter (HOSPITAL_BASED_OUTPATIENT_CLINIC_OR_DEPARTMENT_OTHER): Payer: Self-pay | Admitting: *Deleted

## 2018-02-04 ENCOUNTER — Other Ambulatory Visit: Payer: Self-pay

## 2018-02-04 DIAGNOSIS — N393 Stress incontinence (female) (male): Secondary | ICD-10-CM | POA: Insufficient documentation

## 2018-02-04 DIAGNOSIS — Z885 Allergy status to narcotic agent status: Secondary | ICD-10-CM | POA: Diagnosis not present

## 2018-02-04 DIAGNOSIS — K219 Gastro-esophageal reflux disease without esophagitis: Secondary | ICD-10-CM | POA: Insufficient documentation

## 2018-02-04 DIAGNOSIS — F329 Major depressive disorder, single episode, unspecified: Secondary | ICD-10-CM | POA: Insufficient documentation

## 2018-02-04 DIAGNOSIS — F419 Anxiety disorder, unspecified: Secondary | ICD-10-CM | POA: Insufficient documentation

## 2018-02-04 DIAGNOSIS — Z79899 Other long term (current) drug therapy: Secondary | ICD-10-CM | POA: Diagnosis not present

## 2018-02-04 DIAGNOSIS — D259 Leiomyoma of uterus, unspecified: Secondary | ICD-10-CM | POA: Insufficient documentation

## 2018-02-04 DIAGNOSIS — Z90710 Acquired absence of both cervix and uterus: Secondary | ICD-10-CM | POA: Diagnosis present

## 2018-02-04 DIAGNOSIS — N920 Excessive and frequent menstruation with regular cycle: Secondary | ICD-10-CM | POA: Diagnosis present

## 2018-02-04 DIAGNOSIS — Z880 Allergy status to penicillin: Secondary | ICD-10-CM | POA: Diagnosis not present

## 2018-02-04 DIAGNOSIS — Z888 Allergy status to other drugs, medicaments and biological substances status: Secondary | ICD-10-CM | POA: Diagnosis not present

## 2018-02-04 DIAGNOSIS — N939 Abnormal uterine and vaginal bleeding, unspecified: Secondary | ICD-10-CM | POA: Diagnosis present

## 2018-02-04 DIAGNOSIS — E039 Hypothyroidism, unspecified: Secondary | ICD-10-CM | POA: Insufficient documentation

## 2018-02-04 HISTORY — PX: BLADDER SUSPENSION: SHX72

## 2018-02-04 HISTORY — PX: VAGINAL HYSTERECTOMY: SHX2639

## 2018-02-04 HISTORY — PX: CYSTOSCOPY: SHX5120

## 2018-02-04 LAB — POCT I-STAT, CHEM 8
BUN: 17 mg/dL (ref 6–20)
CALCIUM ION: 1.3 mmol/L (ref 1.15–1.40)
CREATININE: 0.7 mg/dL (ref 0.44–1.00)
Chloride: 106 mmol/L (ref 98–111)
GLUCOSE: 98 mg/dL (ref 70–99)
HCT: 34 % — ABNORMAL LOW (ref 36.0–46.0)
HEMOGLOBIN: 11.6 g/dL — AB (ref 12.0–15.0)
Potassium: 4.3 mmol/L (ref 3.5–5.1)
Sodium: 140 mmol/L (ref 135–145)
TCO2: 25 mmol/L (ref 22–32)

## 2018-02-04 LAB — POCT PREGNANCY, URINE: Preg Test, Ur: NEGATIVE

## 2018-02-04 SURGERY — HYSTERECTOMY, VAGINAL
Anesthesia: General | Site: Vagina

## 2018-02-04 MED ORDER — LIDOCAINE HCL (CARDIAC) PF 100 MG/5ML IV SOSY
PREFILLED_SYRINGE | INTRAVENOUS | Status: DC | PRN
  Administered 2018-02-04: 100 mg via INTRAVENOUS

## 2018-02-04 MED ORDER — KETOROLAC TROMETHAMINE 30 MG/ML IJ SOLN
30.0000 mg | Freq: Once | INTRAMUSCULAR | Status: DC
  Filled 2018-02-04: qty 1

## 2018-02-04 MED ORDER — FENTANYL CITRATE (PF) 100 MCG/2ML IJ SOLN
INTRAMUSCULAR | Status: AC
  Filled 2018-02-04: qty 2

## 2018-02-04 MED ORDER — CITALOPRAM HYDROBROMIDE 20 MG PO TABS: 40.0000 mg | ORAL_TABLET | Freq: Every day | ORAL | Status: DC

## 2018-02-04 MED ORDER — MEPERIDINE HCL 25 MG/ML IJ SOLN
6.2500 mg | INTRAMUSCULAR | Status: DC | PRN
  Filled 2018-02-04: qty 1

## 2018-02-04 MED ORDER — DEXAMETHASONE SODIUM PHOSPHATE 10 MG/ML IJ SOLN
INTRAMUSCULAR | Status: DC | PRN
  Administered 2018-02-04: 5 mg via INTRAVENOUS

## 2018-02-04 MED ORDER — MIDAZOLAM HCL 2 MG/2ML IJ SOLN
INTRAMUSCULAR | Status: AC
  Filled 2018-02-04: qty 2

## 2018-02-04 MED ORDER — GABAPENTIN 300 MG PO CAPS
600.0000 mg | ORAL_CAPSULE | ORAL | Status: AC
  Administered 2018-02-04: 600 mg via ORAL
  Filled 2018-02-04: qty 2

## 2018-02-04 MED ORDER — TRAZODONE HCL 50 MG PO TABS: 150.0000 mg | ORAL_TABLET | Freq: Every day | ORAL | Status: DC

## 2018-02-04 MED ORDER — EPHEDRINE 5 MG/ML INJ
INTRAVENOUS | Status: AC
  Filled 2018-02-04: qty 10

## 2018-02-04 MED ORDER — OXYCODONE HCL 5 MG PO TABS: 5.0000 mg | ORAL_TABLET | ORAL | 0 refills | Status: DC | PRN

## 2018-02-04 MED ORDER — KETOROLAC TROMETHAMINE 30 MG/ML IJ SOLN
INTRAMUSCULAR | Status: AC
  Filled 2018-02-04: qty 1

## 2018-02-04 MED ORDER — SCOPOLAMINE 1 MG/3DAYS TD PT72
MEDICATED_PATCH | TRANSDERMAL | Status: AC
  Filled 2018-02-04: qty 1

## 2018-02-04 MED ORDER — KETOROLAC TROMETHAMINE 30 MG/ML IJ SOLN: 30.0000 mg | Freq: Four times a day (QID) | INTRAMUSCULAR | Status: DC

## 2018-02-04 MED ORDER — ONDANSETRON HCL 4 MG PO TABS: 4.0000 mg | ORAL_TABLET | Freq: Four times a day (QID) | ORAL | Status: DC | PRN

## 2018-02-04 MED ORDER — KETOROLAC TROMETHAMINE 30 MG/ML IJ SOLN
INTRAMUSCULAR | Status: DC | PRN
  Administered 2018-02-04: 30 mg via INTRAVENOUS

## 2018-02-04 MED ORDER — DEXAMETHASONE SODIUM PHOSPHATE 10 MG/ML IJ SOLN
INTRAMUSCULAR | Status: AC
  Filled 2018-02-04: qty 1

## 2018-02-04 MED ORDER — BUPIVACAINE HCL (PF) 0.5 % IJ SOLN
INTRAMUSCULAR | Status: DC | PRN
  Administered 2018-02-04: 30 mL

## 2018-02-04 MED ORDER — EPHEDRINE SULFATE 50 MG/ML IJ SOLN
INTRAMUSCULAR | Status: DC | PRN
  Administered 2018-02-04: 10 mg via INTRAVENOUS

## 2018-02-04 MED ORDER — OXYCODONE HCL 5 MG PO TABS
5.0000 mg | ORAL_TABLET | Freq: Once | ORAL | Status: DC | PRN
  Filled 2018-02-04: qty 1

## 2018-02-04 MED ORDER — ACETAMINOPHEN 500 MG PO TABS
ORAL_TABLET | ORAL | Status: AC
  Filled 2018-02-04: qty 2

## 2018-02-04 MED ORDER — ACETAMINOPHEN 500 MG PO TABS
1000.0000 mg | ORAL_TABLET | ORAL | Status: AC
  Administered 2018-02-04: 1000 mg via ORAL
  Filled 2018-02-04: qty 2

## 2018-02-04 MED ORDER — THYROID 60 MG PO TABS: 60.0000 mg | ORAL_TABLET | Freq: Every day | ORAL | Status: DC

## 2018-02-04 MED ORDER — LACTATED RINGERS IV SOLN
INTRAVENOUS | Status: DC
  Administered 2018-02-04: 07:00:00 via INTRAVENOUS
  Filled 2018-02-04: qty 1000

## 2018-02-04 MED ORDER — ONDANSETRON HCL 4 MG/2ML IJ SOLN
INTRAMUSCULAR | Status: DC | PRN
  Administered 2018-02-04: 4 mg via INTRAVENOUS

## 2018-02-04 MED ORDER — SUGAMMADEX SODIUM 200 MG/2ML IV SOLN
INTRAVENOUS | Status: DC | PRN
  Administered 2018-02-04: 200 mg via INTRAVENOUS

## 2018-02-04 MED ORDER — DEXTROSE-NACL 5-0.45 % IV SOLN: INTRAVENOUS | Status: DC

## 2018-02-04 MED ORDER — ROPINIROLE HCL 1 MG PO TABS: 2.0000 mg | ORAL_TABLET | Freq: Every day | ORAL | Status: DC

## 2018-02-04 MED ORDER — MIDAZOLAM HCL 5 MG/5ML IJ SOLN
INTRAMUSCULAR | Status: DC | PRN
  Administered 2018-02-04: 2 mg via INTRAVENOUS

## 2018-02-04 MED ORDER — PROPOFOL 500 MG/50ML IV EMUL
INTRAVENOUS | Status: AC
  Filled 2018-02-04: qty 50

## 2018-02-04 MED ORDER — MENTHOL 3 MG MT LOZG: 1.0000 | LOZENGE | OROMUCOSAL | Status: DC | PRN

## 2018-02-04 MED ORDER — LIDOCAINE 2% (20 MG/ML) 5 ML SYRINGE
INTRAMUSCULAR | Status: AC
  Filled 2018-02-04: qty 5

## 2018-02-04 MED ORDER — STERILE WATER FOR IRRIGATION IR SOLN
Status: DC | PRN
  Administered 2018-02-04: 3000 mL

## 2018-02-04 MED ORDER — ONDANSETRON HCL 4 MG/2ML IJ SOLN: 4.0000 mg | Freq: Four times a day (QID) | INTRAMUSCULAR | Status: DC | PRN

## 2018-02-04 MED ORDER — LORAZEPAM 1 MG PO TABS: 1.0000 mg | ORAL_TABLET | Freq: Four times a day (QID) | ORAL | Status: DC | PRN

## 2018-02-04 MED ORDER — SCOPOLAMINE 1 MG/3DAYS TD PT72
1.0000 | MEDICATED_PATCH | TRANSDERMAL | Status: DC
  Administered 2018-02-04: 1.5 mg via TRANSDERMAL
  Filled 2018-02-04: qty 1

## 2018-02-04 MED ORDER — ROCURONIUM BROMIDE 10 MG/ML (PF) SYRINGE
PREFILLED_SYRINGE | INTRAVENOUS | Status: AC
  Filled 2018-02-04: qty 10

## 2018-02-04 MED ORDER — FENTANYL CITRATE (PF) 100 MCG/2ML IJ SOLN
INTRAMUSCULAR | Status: DC | PRN
  Administered 2018-02-04 (×2): 100 ug via INTRAVENOUS

## 2018-02-04 MED ORDER — PROMETHAZINE HCL 25 MG/ML IJ SOLN
6.2500 mg | INTRAMUSCULAR | Status: DC | PRN
  Filled 2018-02-04: qty 1

## 2018-02-04 MED ORDER — KETOROLAC TROMETHAMINE 30 MG/ML IJ SOLN
30.0000 mg | Freq: Four times a day (QID) | INTRAMUSCULAR | Status: DC
  Administered 2018-02-04: 30 mg via INTRAVENOUS
  Filled 2018-02-04: qty 1

## 2018-02-04 MED ORDER — OXYCODONE HCL 5 MG/5ML PO SOLN
5.0000 mg | Freq: Once | ORAL | Status: DC | PRN
  Filled 2018-02-04: qty 5

## 2018-02-04 MED ORDER — ROCURONIUM BROMIDE 100 MG/10ML IV SOLN
INTRAVENOUS | Status: DC | PRN
  Administered 2018-02-04: 50 mg via INTRAVENOUS

## 2018-02-04 MED ORDER — VASOPRESSIN 20 UNIT/ML IV SOLN
INTRAVENOUS | Status: DC | PRN
  Administered 2018-02-04: 08:00:00 via INTRAMUSCULAR

## 2018-02-04 MED ORDER — GABAPENTIN 300 MG PO CAPS
ORAL_CAPSULE | ORAL | Status: AC
  Filled 2018-02-04: qty 2

## 2018-02-04 MED ORDER — SIMETHICONE 80 MG PO CHEW: 80.0000 mg | CHEWABLE_TABLET | Freq: Four times a day (QID) | ORAL | Status: DC | PRN

## 2018-02-04 MED ORDER — IBUPROFEN 400 MG PO TABS: 600.0000 mg | ORAL_TABLET | Freq: Four times a day (QID) | ORAL | Status: DC | PRN

## 2018-02-04 MED ORDER — PROPOFOL 10 MG/ML IV BOLUS
INTRAVENOUS | Status: DC | PRN
  Administered 2018-02-04: 150 mg via INTRAVENOUS

## 2018-02-04 MED ORDER — FENTANYL CITRATE (PF) 100 MCG/2ML IJ SOLN
25.0000 ug | INTRAMUSCULAR | Status: DC | PRN
  Administered 2018-02-04 (×2): 25 ug via INTRAVENOUS
  Filled 2018-02-04: qty 1

## 2018-02-04 MED ORDER — MORPHINE SULFATE (PF) 2 MG/ML IV SOLN
1.0000 mg | INTRAVENOUS | Status: DC | PRN
  Administered 2018-02-04: 1 mg via INTRAVENOUS
  Filled 2018-02-04: qty 1

## 2018-02-04 MED ORDER — GABAPENTIN 300 MG PO CAPS
300.0000 mg | ORAL_CAPSULE | Freq: Three times a day (TID) | ORAL | Status: DC
  Administered 2018-02-04: 300 mg via ORAL
  Filled 2018-02-04: qty 1

## 2018-02-04 MED ORDER — ALUM & MAG HYDROXIDE-SIMETH 200-200-20 MG/5ML PO SUSP: 30.0000 mL | ORAL | Status: DC | PRN

## 2018-02-04 MED ORDER — ONDANSETRON HCL 4 MG/2ML IJ SOLN
INTRAMUSCULAR | Status: AC
  Filled 2018-02-04: qty 2

## 2018-02-04 MED ORDER — SUGAMMADEX SODIUM 200 MG/2ML IV SOLN
INTRAVENOUS | Status: AC
  Filled 2018-02-04: qty 2

## 2018-02-04 SURGICAL SUPPLY — 45 items
ADH SKN CLS APL DERMABOND .7 (GAUZE/BANDAGES/DRESSINGS)
BLADE SURG 15 STRL LF C SS BP (BLADE) ×2 IMPLANT
BLADE SURG 15 STRL SS (BLADE) ×4
CANISTER SUCT 3000ML PPV (MISCELLANEOUS) ×4 IMPLANT
CATH ROBINSON RED A/P 16FR (CATHETERS) ×2 IMPLANT
CATH SILICONE 14FRX5CC (CATHETERS) ×4 IMPLANT
COVER WAND RF STERILE (DRAPES) ×4 IMPLANT
DECANTER SPIKE VIAL GLASS SM (MISCELLANEOUS) IMPLANT
DERMABOND ADVANCED (GAUZE/BANDAGES/DRESSINGS)
DERMABOND ADVANCED .7 DNX12 (GAUZE/BANDAGES/DRESSINGS) IMPLANT
GAUZE 4X4 16PLY RFD (DISPOSABLE) ×4 IMPLANT
GAUZE PACKING 2X5 YD STRL (GAUZE/BANDAGES/DRESSINGS) IMPLANT
GLOVE BIOGEL PI IND STRL 7.0 (GLOVE) ×2 IMPLANT
GLOVE BIOGEL PI IND STRL 7.5 (GLOVE) IMPLANT
GLOVE BIOGEL PI IND STRL 8 (GLOVE) ×2 IMPLANT
GLOVE BIOGEL PI INDICATOR 7.0 (GLOVE) ×2
GLOVE BIOGEL PI INDICATOR 7.5 (GLOVE) ×6
GLOVE BIOGEL PI INDICATOR 8 (GLOVE) ×2
GLOVE ORTHO TXT STRL SZ7.5 (GLOVE) ×2 IMPLANT
GLOVE SURG SS PI 7.0 STRL IVOR (GLOVE) ×2 IMPLANT
GLOVE SURG SS PI 7.5 STRL IVOR (GLOVE) ×4 IMPLANT
GOWN STRL REUS W/TWL LRG LVL3 (GOWN DISPOSABLE) ×12 IMPLANT
GOWN STRL REUS W/TWL XL LVL3 (GOWN DISPOSABLE) ×4 IMPLANT
NEEDLE HYPO 22GX1.5 SAFETY (NEEDLE) ×4 IMPLANT
NS IRRIG 1000ML POUR BTL (IV SOLUTION) ×4 IMPLANT
NS IRRIG 500ML POUR BTL (IV SOLUTION) ×4 IMPLANT
PACK TRENDGUARD 450 HYBRID PRO (MISCELLANEOUS) IMPLANT
PACK VAGINAL WOMENS (CUSTOM PROCEDURE TRAY) ×4 IMPLANT
PAD OB MATERNITY 4.3X12.25 (PERSONAL CARE ITEMS) ×4 IMPLANT
SET CYSTO W/LG BORE CLAMP LF (SET/KITS/TRAYS/PACK) ×2 IMPLANT
SET IRRIG Y TYPE TUR BLADDER L (SET/KITS/TRAYS/PACK) IMPLANT
SHEARS FOC LG CVD HARMONIC 17C (MISCELLANEOUS) ×4 IMPLANT
SLING SOLYX SYSTEM SIS EA (Sling) ×2 IMPLANT
SUT CHROMIC 1 TIES 18 (SUTURE) ×4 IMPLANT
SUT CHROMIC 1MO 4 18 CR8 (SUTURE) ×4 IMPLANT
SUT SILK 2 0 SH (SUTURE) ×4 IMPLANT
SUT VIC AB 2-0 CT1 (SUTURE) ×4 IMPLANT
SUT VIC AB 2-0 CT1 27 (SUTURE) ×4
SUT VIC AB 2-0 CT1 TAPERPNT 27 (SUTURE) ×2 IMPLANT
SUT VIC AB 2-0 CT2 27 (SUTURE) IMPLANT
SUT VIC AB 3-0 SH 27 (SUTURE)
SUT VIC AB 3-0 SH 27X BRD (SUTURE) IMPLANT
TOWEL OR 17X24 6PK STRL BLUE (TOWEL DISPOSABLE) ×8 IMPLANT
TRAY FOLEY W/BAG SLVR 14FR (SET/KITS/TRAYS/PACK) IMPLANT
TRENDGUARD 450 HYBRID PRO PACK (MISCELLANEOUS)

## 2018-02-04 NOTE — Op Note (Signed)
Preoperative diagnosis: Abnormal uterine bleeding, stress incontinence Postop diagnosis: Same Procedure: Total vaginal hysterectomy, bilateral salpingectomy, Solyx sling and cystoscopy Surgeon: Cheri Fowler M.D. Assistant: Crawford Givens, D.O. Anesthesia: Gen. Findings: Patient had a normal uterus with normal tubes and ovaries. Via cystoscopy bladder was normal and both ureters were patent.  Assistance from Dr. Terri Piedra was necessary throughout the case to help expose pedicles and control bleeding Estimated blood loss: 100 cc Specimens: Uterus and tubes for routine pathology Complications: None   Procedure in detail: The patient was taken to the operating room and placed in the dorsosupine position. General anesthesia was induced and she was placed in mobile stirrups. Legs were elevated in the stirrups. Perineum and vagina were then prepped and draped in the usual sterile fashion, bladder drained with latex free catheter. A Graves speculum was inserted in the vagina and the cervix was grasped with Ardis Hughs tenaculum. Dilute Pitressin with Marcainewas then instilled at the cervicovaginal junction which was then incised circumferentially with electrocautery. Sharp dissection was then used to further free the vagina from the cervix. Anterior peritoneum was identified and entered sharply. A Deaver retractor was used to retract the bladder anteriorly. Posterior cul-de-sac was identified and entered sharply. A Bonnano speculum was placed into the posterior cul-de-sac. Uterosacral ligaments were clamped transected and ligated with #1 chromic and tagged for later use. The remaining pedicles were then taken down with harmonic scalpel.  When just the tubes were still attached, the uterus was delivered posteriorly and both tubes were identified and traced to their fimbria.  Harmonic scalpel was the used to free the tubes and any remaining attachments of the the uterus, the uterus and tubes were removed. Ovaries were  inspected and found to be normal with a simple cyst on left ovary. A small amount of bleeding was controlled with the Bovie.  The uterosacral ligaments were then plicated in the midline with 2-0 silk after the Bonnano speculum was removed and a shorter vaginal speculum was placed. The previously tagged uterosacral pedicles were also tied in the midline. No significant bleeding was identified. The vagina was then closed in a vertical fashion with running locking 2-0 Vicryl with adequate closure and adequate hemostasis.    The anterior vagina was grasped with Allis clamps proximally 1 1/2 fingerbreadths from the urethral meatus. Local anesthetic with half percent Marcaine was infiltrated in the midline and bilaterally for hydrodissection. A 1 cm vertical incision was was then made in the vagina between the Allis clamps. The edges of the incision were then grasped with the Allis clamps. Metzenbaum scissors were used to sharply dissected the vaginal mucosa to each pubic ramus. The Solyx sling was then first placed on the patient's right side and anchored behind the pubic bone.  A good placement was achieved on the right side. Placement was achieved on the left side in a similar fashion submucosal to just past the pubic ramus. A right angle clamp was able to just barely be passed between the sling and the urethral tissue confirming good tension. Cystoscopy was performed which revealed a normal bladder and no evidence of injury to the bladder or the urethra, urine seen flowing from each ureter. 150 cc of fluid was used for the cystoscopy. The cystoscope was removed. A Cred maneuver was performed and no leakage was seen. The Solyx sling was released on the left side. The vaginal incision was then closed with running locking 2-0 Vicryl with adequate closure and adequate hemostasis. All instruments were then removed from the  vagina. The patient tolerated the procedure well and was taken to the recovery room in stable  condition. Counts were correct, she received Gentamicin and clindamycin prior to the procedure, she had PAS hose on throughout the procedure.

## 2018-02-04 NOTE — Anesthesia Procedure Notes (Signed)
Procedure Name: Intubation Date/Time: 02/04/2018 7:35 AM Performed by: Jonna Munro, CRNA Pre-anesthesia Checklist: Patient identified, Emergency Drugs available, Suction available, Patient being monitored and Timeout performed Patient Re-evaluated:Patient Re-evaluated prior to induction Oxygen Delivery Method: Circle system utilized Preoxygenation: Pre-oxygenation with 100% oxygen Induction Type: IV induction Ventilation: Mask ventilation without difficulty Laryngoscope Size: Mac and 3 Grade View: Grade I Tube type: Oral Tube size: 7.0 mm Number of attempts: 1 Airway Equipment and Method: Stylet Placement Confirmation: ETT inserted through vocal cords under direct vision,  positive ETCO2 and breath sounds checked- equal and bilateral Secured at: 22 cm Tube secured with: Tape Dental Injury: Teeth and Oropharynx as per pre-operative assessment

## 2018-02-04 NOTE — Progress Notes (Signed)
Post-op check, s/p TVH and sling Doing well, some cramping, no bleeding, voiding well, no n/v-tol PO Afeb, VSS Abd- soft D/c home

## 2018-02-04 NOTE — Interval H&P Note (Signed)
History and Physical Interval Note:  02/04/2018 7:16 AM  [name]  has presented today for surgery, with the diagnosis of menorrhagia  The various methods of treatment have been discussed with the patient and family. After consideration of risks, benefits and other options for treatment, the patient has consented to  Procedure(s) with comments: HYSTERECTOMY VAGINAL (N/A) - possible bilateral salpingectomy CYSTOSCOPY (N/A) - possible TRANSVAGINAL TAPE (TVT) PROCEDURE (N/A) as a surgical intervention .  The patient's history has been reviewed, patient examined, no change in status, stable for surgery.  I have reviewed the patient's chart and labs.  Questions were answered to the patient's satisfaction.     Blane Ohara Braddock Servellon

## 2018-02-04 NOTE — Anesthesia Postprocedure Evaluation (Signed)
Anesthesia Post Note  Patient: Stacy Villanueva  Procedure(s) Performed: HYSTERECTOMY VAGINAL, BILATERAL SALPINGECTOMY (N/A Vagina ) CYSTOSCOPY (N/A Bladder) SOLYX SLING (N/A Vagina )     Patient location during evaluation: PACU Anesthesia Type: General Level of consciousness: awake and alert Pain management: pain level controlled Vital Signs Assessment: post-procedure vital signs reviewed and stable Respiratory status: spontaneous breathing, nonlabored ventilation and respiratory function stable Cardiovascular status: blood pressure returned to baseline and stable Postop Assessment: no apparent nausea or vomiting Anesthetic complications: no    Last Vitals:  Vitals:   02/04/18 1000 02/04/18 1015  BP:  103/65  Pulse: 96 82  Resp: 16 18  Temp:    SpO2: 96% 100%    Last Pain:  Vitals:   02/04/18 1000  TempSrc:   PainSc: 2                  Lynda Rainwater

## 2018-02-04 NOTE — Anesthesia Preprocedure Evaluation (Signed)
Anesthesia Evaluation  Patient identified by MRN, date of birth, ID band Patient awake    Reviewed: Allergy & Precautions, NPO status , Patient's Chart, lab work & pertinent test results  History of Anesthesia Complications Negative for: history of anesthetic complications  Airway Mallampati: II  TM Distance: >3 FB Neck ROM: Full    Dental  (+) Dental Advisory Given   Pulmonary neg pulmonary ROS,    breath sounds clear to auscultation       Cardiovascular negative cardio ROS   Rhythm:Regular Rate:Normal     Neuro/Psych Anxiety Depression negative neurological ROS     GI/Hepatic Neg liver ROS, GERD  Poorly Controlled,  Endo/Other  Hypothyroidism   Renal/GU negative Renal ROS     Musculoskeletal   Abdominal (+) - obese,   Peds  Hematology negative hematology ROS (+)   Anesthesia Other Findings   Reproductive/Obstetrics                             Anesthesia Physical  Anesthesia Plan  ASA: II  Anesthesia Plan: General   Post-op Pain Management:    Induction: Intravenous  PONV Risk Score and Plan: 3 and Ondansetron, Dexamethasone and Midazolam  Airway Management Planned: Oral ETT  Additional Equipment:   Intra-op Plan:   Post-operative Plan: Extubation in OR  Informed Consent: I have reviewed the patients History and Physical, chart, labs and discussed the procedure including the risks, benefits and alternatives for the proposed anesthesia with the patient or authorized representative who has indicated his/her understanding and acceptance.   Dental advisory given  Plan Discussed with: CRNA and Surgeon  Anesthesia Plan Comments: (Plan routine monitors, GETA )        Anesthesia Quick Evaluation

## 2018-02-04 NOTE — Discharge Instructions (Signed)
Routine instructions for vaginal hysterectomy and TVT

## 2018-02-04 NOTE — Progress Notes (Signed)
02/04/2018  5:40 PM D/c avs form, medications already taken today and those due this evening given and explained to patient. Follow up appointments and when to call MD reviewed. RX reviewed. D/c iv. D/c home per orders. Ardeth Repetto, Arville Lime

## 2018-02-04 NOTE — Transfer of Care (Signed)
Immediate Anesthesia Transfer of Care Note  Patient: Avianah Pellman  Procedure(s) Performed: HYSTERECTOMY VAGINAL, BILATERAL SALPINGECTOMY (N/A Vagina ) CYSTOSCOPY (N/A Bladder) SOLYX SLING (N/A Vagina )  Patient Location: PACU  Anesthesia Type:General  Level of Consciousness: awake, alert  and oriented  Airway & Oxygen Therapy: Patient Spontanous Breathing and Patient connected to nasal cannula oxygen  Post-op Assessment: Report given to RN and Post -op Vital signs reviewed and stable  Post vital signs: Reviewed and stable  Last Vitals:  Vitals Value Taken Time  BP    Temp    Pulse    Resp    SpO2      Last Pain:  Vitals:   02/04/18 0613  TempSrc:   PainSc: 0-No pain      Patients Stated Pain Goal: 4 (28/11/88 6773)  Complications: No apparent anesthesia complications

## 2018-02-05 ENCOUNTER — Encounter (HOSPITAL_BASED_OUTPATIENT_CLINIC_OR_DEPARTMENT_OTHER): Payer: Self-pay | Admitting: Obstetrics and Gynecology

## 2018-02-20 ENCOUNTER — Other Ambulatory Visit (HOSPITAL_COMMUNITY): Payer: Self-pay | Admitting: Obstetrics and Gynecology

## 2018-02-20 DIAGNOSIS — G8918 Other acute postprocedural pain: Secondary | ICD-10-CM

## 2018-02-20 DIAGNOSIS — R109 Unspecified abdominal pain: Principal | ICD-10-CM

## 2018-02-24 ENCOUNTER — Encounter (HOSPITAL_COMMUNITY): Payer: Self-pay | Admitting: Radiology

## 2018-02-24 ENCOUNTER — Ambulatory Visit (HOSPITAL_COMMUNITY)
Admission: RE | Admit: 2018-02-24 | Discharge: 2018-02-24 | Disposition: A | Discharge status: Home / Self Care | Payer: Commercial Managed Care - PPO | Source: Ambulatory Visit | Attending: Obstetrics and Gynecology | Admitting: Obstetrics and Gynecology

## 2018-02-24 DIAGNOSIS — G8918 Other acute postprocedural pain: Secondary | ICD-10-CM | POA: Diagnosis present

## 2018-02-24 DIAGNOSIS — R109 Unspecified abdominal pain: Secondary | ICD-10-CM

## 2018-02-24 MED ORDER — SODIUM CHLORIDE (PF) 0.9 % IJ SOLN
INTRAMUSCULAR | Status: AC
  Filled 2018-02-24: qty 50

## 2018-02-24 MED ORDER — IOHEXOL 300 MG/ML  SOLN
100.0000 mL | Freq: Once | INTRAMUSCULAR | Status: AC | PRN
  Administered 2018-02-24: 100 mL via INTRAVENOUS

## 2018-09-29 ENCOUNTER — Emergency Department (HOSPITAL_COMMUNITY): Payer: Commercial Managed Care - PPO

## 2018-09-29 ENCOUNTER — Other Ambulatory Visit: Payer: Self-pay

## 2018-09-29 ENCOUNTER — Encounter (HOSPITAL_COMMUNITY): Payer: Self-pay

## 2018-09-29 ENCOUNTER — Emergency Department (HOSPITAL_COMMUNITY)
Admission: EM | Admit: 2018-09-29 | Discharge: 2018-09-30 | Disposition: A | Discharge status: Home / Self Care | Payer: Commercial Managed Care - PPO | Attending: Emergency Medicine | Admitting: Emergency Medicine

## 2018-09-29 DIAGNOSIS — Z880 Allergy status to penicillin: Secondary | ICD-10-CM | POA: Insufficient documentation

## 2018-09-29 DIAGNOSIS — Z91040 Latex allergy status: Secondary | ICD-10-CM | POA: Insufficient documentation

## 2018-09-29 DIAGNOSIS — Z79899 Other long term (current) drug therapy: Secondary | ICD-10-CM | POA: Diagnosis not present

## 2018-09-29 DIAGNOSIS — E039 Hypothyroidism, unspecified: Secondary | ICD-10-CM | POA: Insufficient documentation

## 2018-09-29 DIAGNOSIS — Z888 Allergy status to other drugs, medicaments and biological substances status: Secondary | ICD-10-CM | POA: Diagnosis not present

## 2018-09-29 DIAGNOSIS — M5432 Sciatica, left side: Secondary | ICD-10-CM | POA: Diagnosis not present

## 2018-09-29 DIAGNOSIS — Z885 Allergy status to narcotic agent status: Secondary | ICD-10-CM | POA: Insufficient documentation

## 2018-09-29 DIAGNOSIS — M545 Low back pain: Secondary | ICD-10-CM | POA: Diagnosis present

## 2018-09-29 MED ORDER — ONDANSETRON HCL 4 MG/2ML IJ SOLN
4.0000 mg | Freq: Once | INTRAMUSCULAR | Status: AC
  Administered 2018-09-29: 4 mg via INTRAVENOUS
  Filled 2018-09-29: qty 2

## 2018-09-29 MED ORDER — DEXAMETHASONE SODIUM PHOSPHATE 10 MG/ML IJ SOLN
10.0000 mg | Freq: Once | INTRAMUSCULAR | Status: AC
  Administered 2018-09-30: 10 mg via INTRAMUSCULAR
  Filled 2018-09-29: qty 1

## 2018-09-29 MED ORDER — LORAZEPAM 2 MG/ML IJ SOLN
1.0000 mg | Freq: Once | INTRAMUSCULAR | Status: AC
  Administered 2018-09-29: 1 mg via INTRAVENOUS
  Filled 2018-09-29: qty 1

## 2018-09-29 MED ORDER — MORPHINE SULFATE (PF) 4 MG/ML IV SOLN
6.0000 mg | Freq: Once | INTRAVENOUS | Status: AC
  Administered 2018-09-29: 6 mg via INTRAVENOUS
  Filled 2018-09-29: qty 2

## 2018-09-29 MED ORDER — SODIUM CHLORIDE 0.9 % IV SOLN
INTRAVENOUS | Status: DC
  Administered 2018-09-29: 22:00:00 10 mL/h via INTRAVENOUS

## 2018-09-29 NOTE — ED Notes (Signed)
Patient transported to MRI 

## 2018-09-29 NOTE — ED Provider Notes (Signed)
[facility] EMERGENCY DEPARTMENT Provider Note   CSN: [id number] Arrival date & time: 09/29/18  2021     History   Chief Complaint Chief Complaint  Patient presents with  . Back Pain    HPI Stacy Villanueva is a 48 y.o. female.     Patient with hx ddd, three prior back surgeries, c/o low back pain acute onset today. Had bent over at waist, and felt sudden, severe pain to lower back radiating down left leg, with left foot numbness. Pain acute onset, severe, constant, radiating, worse w movement/position change. Denies chronic back pain, noting no back pain today prior to bending. Denies leg swelling. No weakness. No saddle area or perineal numbness. No urinary retention or incontinence. No stool incontinence. No fever or chills.   The history is provided by the patient.  Back Pain Associated symptoms: numbness   Associated symptoms: no abdominal pain, no chest pain, no fever and no headaches     Past Medical History:  Diagnosis Date  . Anxiety   . Back pain   . Depression   . Family history of adverse reaction to anesthesia    daughter is slow to wake up, slight nausea  . GERD (gastroesophageal reflux disease)    10/19/14- not currently  occasional  . Hypothyroidism   . SVD (spontaneous vaginal delivery)    x 2    Patient Active Problem List   Diagnosis Date Noted  . Menorrhagia 02/04/2018  . S/P vaginal hysterectomy 02/04/2018  . 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 HNP (herniated nucleus pulposus), lumbar 08/18/2014    Past Surgical History:  Procedure Laterality Date  . BACK SURGERY  03/2015   lumbar laminectomy x3  . BLADDER SUSPENSION N/A 02/04/2018   Procedure: Velvet Bathe;  Surgeon: Cheri Fowler, MD;  Location: [facility];  Service: Gynecology;  Laterality: N/A;  . CYSTOSCOPY N/A 02/04/2018   Procedure: CYSTOSCOPY;  Surgeon: Cheri Fowler, MD;  Location: [facility];  Service: Gynecology;   Laterality: N/A;  . DILATATION & CURETTAGE/HYSTEROSCOPY WITH MYOSURE N/A 07/23/2017   Procedure: DILATATION & CURETTAGE/HYSTEROSCOPY WITH MYOSURE RESECTION OF ENDOMETRIAL LESION;  Surgeon: Cheri Fowler, MD;  Location: [facility];  Service: Gynecology;  Laterality: N/A;  . EYE SURGERY Left    cataraacts removed w/lens implant  . LUMBAR LAMINECTOMY/DECOMPRESSION MICRODISCECTOMY Bilateral 08/18/2014   Procedure: LUMBAR FIVE-SACRAL ONE LUMBAR LAMINECTOMY/DECOMPRESSION MICRODISCECTOMY 1 LEVEL;  Surgeon: Ashok Pall, MD;  Location: [facility];  Service: Neurosurgery;  Laterality: Bilateral;  Bilat L5S1 microdiskectomy  . LUMBAR LAMINECTOMY/DECOMPRESSION MICRODISCECTOMY Bilateral 10/20/2014   Procedure: Bilateral Fifth Lumbar Vertebra First Sacral Vertebra Microdiskectomy;  Surgeon: Ashok Pall, MD;  Location: [place];  Service: Neurosurgery;  Laterality: Bilateral;  Bilateral L5S1 microdiskectomy  . VAGINAL HYSTERECTOMY N/A 02/04/2018   Procedure: HYSTERECTOMY VAGINAL, BILATERAL SALPINGECTOMY;  Surgeon: Cheri Fowler, MD;  Location: [facility];  Service: Gynecology;  Laterality: N/A;     OB History   No obstetric history on file.      Home Medications    Prior to Admission medications   Medication Sig Start Date End Date Taking? Authorizing Provider  citalopram (CELEXA) 40 MG tablet Take 40 mg by mouth daily.     [provider]  gabapentin (NEURONTIN) 300 MG capsule Take 300 mg by mouth at bedtime.     [provider]  ibuprofen (ADVIL,MOTRIN) 200 MG tablet Take 600 mg by mouth 2 (two) times daily as needed for headache or  moderate pain.     [provider]  LORazepam (ATIVAN) 1 MG tablet Take 1 mg by mouth 4 (four) times daily as needed for anxiety.     [provider]  NATURE-THROID 48.75 MG TABS Take 48.75 mg by mouth daily. 07/04/17   [provider]  oxyCODONE (ROXICODONE) 5 MG immediate release tablet Take 1 tablet (5 mg  total) by mouth every 4 (four) hours as needed for severe pain. 02/04/18   Meisinger, Todd, MD  rOPINIRole (REQUIP) 2 MG tablet Take 2 mg by mouth at bedtime.    [provider]  traZODone (DESYREL) 150 MG tablet Take 150 mg by mouth at bedtime.    [provider]    Family History Family History  Problem Relation Age of Onset  . Hypertension Mother   . Hypertension Father     Social History Social History   Tobacco Use  . Smoking status: Never Smoker  . Smokeless tobacco: Never Used  Substance Use Topics  . Alcohol use: No  . Drug use: No     Allergies   Meloxicam, Penicillins, Dilaudid [hydromorphone hcl], and Latex   Review of Systems Review of Systems  Constitutional: Negative for chills and fever.  HENT: Negative for sore throat.   Eyes: Negative for redness.  Respiratory: Negative for cough and shortness of breath.   Cardiovascular: Negative for chest pain.  Gastrointestinal: Negative for abdominal pain.  Genitourinary:       No urinary retention.   Musculoskeletal: Positive for back pain.  Skin: Negative for rash.  Neurological: Positive for numbness. Negative for headaches.  Hematological: Does not bruise/bleed easily.  Psychiatric/Behavioral: Negative for confusion.     Physical Exam Updated Vital Signs BP (!) 160/78 (BP Location: Left Arm)   Pulse 99   Temp 98.5 F (36.9 C) (Oral)   Resp 20   LMP  (LMP Unknown)   SpO2 97%   Physical Exam Vitals signs and nursing note reviewed.  Constitutional:      Appearance: Normal appearance. She is well-developed.  HENT:     Head: Atraumatic.     Nose: Nose normal.     Mouth/Throat:     Mouth: Mucous membranes are moist.  Eyes:     General: No scleral icterus.    Conjunctiva/sclera: Conjunctivae normal.  Neck:     Musculoskeletal: Normal range of motion and neck supple. No muscular tenderness.     Trachea: No tracheal deviation.  Cardiovascular:     Rate and Rhythm: Normal rate  and regular rhythm.     Pulses: Normal pulses.  Pulmonary:     Effort: Pulmonary effort is normal. No respiratory distress.  Abdominal:     General: There is no distension.     Palpations: Abdomen is soft.     Tenderness: There is no abdominal tenderness.  Genitourinary:    Comments: No cva tenderness.  Musculoskeletal:        General: No swelling.     Comments: Lower lumbar tenderness, otherwise, CTLS spine, non tender, aligned, no step off. Good rom at left hip and knee without pain, distal pulses palp. No leg swelling.   Skin:    General: Skin is warm and dry.     Findings: No rash.  Neurological:     Mental Status: She is alert.     Comments: Alert, speech normal. Straight leg raise pos on left. LLE motor intact, stre 5/5. sens touch/pressure, grossly intact. Reflexes intact.   Psychiatric:  Comments: Very anxious/tearful.       ED Treatments / Results  Labs (all labs ordered are listed, but only abnormal results are displayed) Labs Reviewed - No data to display  EKG None  Radiology No results found.  Procedures Procedures (including critical care time)  Medications Ordered in ED Medications  0.9 %  sodium chloride infusion (10 mL/hr Intravenous New Bag/Given 09/29/18 2207)  LORazepam (ATIVAN) injection 1 mg (has no administration in time range)  ondansetron (ZOFRAN) injection 4 mg (4 mg Intravenous Given 09/29/18 2205)  morphine 4 MG/ML injection 6 mg (6 mg Intravenous Given 09/29/18 2208)     Initial Impression / Assessment and Plan / ED Course  I have reviewed the triage vital signs and the nursing notes.  Pertinent labs & imaging results that were available during my care of the patient were reviewed by me and considered in my medical decision making (see chart for details).  Iv ns. Morphine iv. zofran iv.  Pt very anxious. Hx same, takes ativan.  Ativan 1 mg iv.   Reviewed nursing notes and prior charts for additional history.    Persistent/intractable pain, radicular pain w numbness - will get MR.   2315 MRI pending - signed out to Dr Leonette Monarch to check mri when resulted, recheck pt, and dispo appropriately.     Final Clinical Impressions(s) / ED Diagnoses   Final diagnoses:  None    ED Discharge Orders    None       Lajean Saver, MD 09/29/18 2319

## 2018-09-29 NOTE — ED Triage Notes (Signed)
Pt reports she bent over and felt a sharp pain in her back. She reports pain down her left leg. Pt crying in triage and appears to be in severe pain, tachycardic as well.

## 2018-09-30 MED ORDER — OXYCODONE-ACETAMINOPHEN 5-325 MG PO TABS: 1.0000 | ORAL_TABLET | Freq: Three times a day (TID) | ORAL | 0 refills | Status: AC | PRN

## 2018-09-30 MED ORDER — OXYCODONE-ACETAMINOPHEN 5-325 MG PO TABS
1.0000 | ORAL_TABLET | Freq: Once | ORAL | Status: AC
  Administered 2018-09-30: 1 via ORAL
  Filled 2018-09-30: qty 1

## 2018-09-30 MED ORDER — METHYLPREDNISOLONE 4 MG PO TBPK: ORAL_TABLET | ORAL | 0 refills | Status: DC

## 2018-09-30 MED ORDER — GABAPENTIN 300 MG PO CAPS: 300.0000 mg | ORAL_CAPSULE | Freq: Three times a day (TID) | ORAL | 0 refills | Status: DC

## 2018-09-30 MED ORDER — ACETAMINOPHEN 500 MG PO TABS: 1000.0000 mg | ORAL_TABLET | Freq: Three times a day (TID) | ORAL | 0 refills | Status: AC

## 2018-09-30 MED ORDER — OXYCODONE-ACETAMINOPHEN 5-325 MG PO TABS: 1.0000 | ORAL_TABLET | Freq: Three times a day (TID) | ORAL | 0 refills | Status: DC | PRN

## 2018-09-30 NOTE — ED Provider Notes (Signed)
MRI without evidence of cauda equina.  Patient pain controlled.  Already established with Dr. Christella Noa.  Instructed to follow-up closely.  The patient is safe for discharge with strict return precautions.   The patient appears reasonably screened and/or stabilized for discharge and I doubt any other medical condition or other [facility] requiring further screening, evaluation, or treatment in the ED at this time prior to discharge.  Disposition: Discharge  Condition: Good  I have discussed the results, Dx and Tx plan with the patient who expressed understanding and agree(s) with the plan. Discharge instructions discussed at great length. The patient was given strict return precautions who verbalized understanding of the instructions. No further questions at time of discharge.    ED Discharge Orders         Ordered    acetaminophen (TYLENOL) 500 MG tablet  Every 8 hours     09/30/18 0106    methylPREDNISolone (MEDROL DOSEPAK) 4 MG TBPK tablet     09/30/18 0106    gabapentin (NEURONTIN) 300 MG capsule  3 times daily     09/30/18 0106    oxyCODONE-acetaminophen (PERCOCET) 5-325 MG tablet  Every 8 hours PRN,   Status:  Discontinued     09/30/18 0109    oxyCODONE-acetaminophen (PERCOCET) 5-325 MG tablet  Every 8 hours PRN     09/30/18 0131           Follow Up: Ashok Pall, MD [address] [place] [address] [phone]  Schedule an appointment as soon as possible for a visit     .   Fatima Blank, MD 09/30/18 [phone]

## 2019-06-22 ENCOUNTER — Other Ambulatory Visit: Payer: Self-pay | Admitting: Neurosurgery

## 2019-06-23 ENCOUNTER — Other Ambulatory Visit (HOSPITAL_COMMUNITY)
Admission: RE | Admit: 2019-06-23 | Discharge: 2019-06-23 | Disposition: A | Discharge status: Home / Self Care | Payer: Managed Care, Other (non HMO) | Source: Ambulatory Visit | Attending: Neurosurgery | Admitting: Neurosurgery

## 2019-06-23 DIAGNOSIS — Z20822 Contact with and (suspected) exposure to covid-19: Secondary | ICD-10-CM | POA: Diagnosis not present

## 2019-06-23 DIAGNOSIS — Z01812 Encounter for preprocedural laboratory examination: Secondary | ICD-10-CM | POA: Diagnosis present

## 2019-06-23 LAB — SARS CORONAVIRUS 2 (TAT 6-24 HRS): SARS Coronavirus 2: NEGATIVE

## 2019-06-25 ENCOUNTER — Encounter (HOSPITAL_COMMUNITY): Payer: Self-pay | Admitting: Neurosurgery

## 2019-06-25 ENCOUNTER — Other Ambulatory Visit: Payer: Self-pay

## 2019-06-25 NOTE — Progress Notes (Signed)
Stacy Villanueva denies chest pain or shortness of breath. Patient was testednegativefor Covid 06/23/2019 and has been in quarantine since that time.

## 2019-06-26 ENCOUNTER — Other Ambulatory Visit: Payer: Self-pay

## 2019-06-26 ENCOUNTER — Encounter (HOSPITAL_COMMUNITY)
Admission: RE | Disposition: A | Discharge status: Home / Self Care | Payer: Self-pay | Source: Home / Self Care | Attending: Neurosurgery

## 2019-06-26 ENCOUNTER — Inpatient Hospital Stay (HOSPITAL_COMMUNITY): Payer: Managed Care, Other (non HMO) | Admitting: Registered Nurse

## 2019-06-26 ENCOUNTER — Ambulatory Visit (HOSPITAL_COMMUNITY)
Admission: RE | Admit: 2019-06-26 | Discharge: 2019-06-26 | Disposition: A | Discharge status: Home / Self Care | Payer: Managed Care, Other (non HMO) | Attending: Neurosurgery | Admitting: Neurosurgery

## 2019-06-26 ENCOUNTER — Encounter (HOSPITAL_COMMUNITY): Payer: Self-pay | Admitting: Neurosurgery

## 2019-06-26 DIAGNOSIS — Z01812 Encounter for preprocedural laboratory examination: Secondary | ICD-10-CM | POA: Diagnosis not present

## 2019-06-26 DIAGNOSIS — Z20822 Contact with and (suspected) exposure to covid-19: Secondary | ICD-10-CM | POA: Insufficient documentation

## 2019-06-26 HISTORY — DX: Panic disorder (episodic paroxysmal anxiety): F41.0

## 2019-06-26 HISTORY — DX: Dyspnea, unspecified: R06.00

## 2019-06-26 LAB — CBC
HCT: 37.8 % (ref 36.0–46.0)
Hemoglobin: 12.3 g/dL (ref 12.0–15.0)
MCH: 28.1 pg (ref 26.0–34.0)
MCHC: 32.5 g/dL (ref 30.0–36.0)
MCV: 86.5 fL (ref 80.0–100.0)
Platelets: 158 10*3/uL (ref 150–400)
RBC: 4.37 MIL/uL (ref 3.87–5.11)
RDW: 12.9 % (ref 11.5–15.5)
WBC: 6.1 10*3/uL (ref 4.0–10.5)
nRBC: 0 % (ref 0.0–0.2)

## 2019-06-26 LAB — TYPE AND SCREEN
ABO/RH(D): A POS
Antibody Screen: NEGATIVE

## 2019-06-26 SURGERY — POSTERIOR LUMBAR FUSION 1 LEVEL
Anesthesia: General

## 2019-06-26 MED ORDER — PROPOFOL 10 MG/ML IV BOLUS
INTRAVENOUS | Status: AC
  Filled 2019-06-26: qty 20

## 2019-06-26 MED ORDER — SUFENTANIL CITRATE 250 MCG/5ML IV SOLN
0.2500 ug/kg/h | INTRAVENOUS | Status: DC
  Filled 2019-06-26: qty 5

## 2019-06-26 MED ORDER — MIDAZOLAM HCL 2 MG/2ML IJ SOLN
INTRAMUSCULAR | Status: AC
  Filled 2019-06-26: qty 2

## 2019-06-26 MED ORDER — FENTANYL CITRATE (PF) 250 MCG/5ML IJ SOLN
INTRAMUSCULAR | Status: AC
  Filled 2019-06-26: qty 5

## 2019-06-26 NOTE — Progress Notes (Signed)
Per Dr. Christella Noa, surgery was not approved by insurance.     IV removed, catheter intact, site clean and dry.  Patient dressed and ambulated to main entrance to meet husband by nurse tech.

## 2019-07-13 ENCOUNTER — Other Ambulatory Visit: Payer: Self-pay | Admitting: Neurosurgery

## 2019-07-21 ENCOUNTER — Other Ambulatory Visit (HOSPITAL_COMMUNITY)
Admission: RE | Admit: 2019-07-21 | Discharge: 2019-07-21 | Disposition: A | Discharge status: Home / Self Care | Payer: Managed Care, Other (non HMO) | Source: Ambulatory Visit | Attending: Neurosurgery | Admitting: Neurosurgery

## 2019-07-21 ENCOUNTER — Other Ambulatory Visit: Payer: Self-pay

## 2019-07-21 DIAGNOSIS — Z01812 Encounter for preprocedural laboratory examination: Secondary | ICD-10-CM | POA: Diagnosis not present

## 2019-07-21 DIAGNOSIS — Z20822 Contact with and (suspected) exposure to covid-19: Secondary | ICD-10-CM | POA: Insufficient documentation

## 2019-07-22 LAB — SARS CORONAVIRUS 2 (TAT 6-24 HRS): SARS Coronavirus 2: NEGATIVE

## 2019-07-23 ENCOUNTER — Other Ambulatory Visit: Payer: Self-pay

## 2019-07-23 ENCOUNTER — Encounter (HOSPITAL_COMMUNITY): Payer: Self-pay | Admitting: Neurosurgery

## 2019-07-23 NOTE — Progress Notes (Signed)
Stacy Villanueva denies chest pain or shortness of breath. Patient tested negative, 5/4/2021for Covid and has been in quarantine since that time.

## 2019-07-24 ENCOUNTER — Encounter (HOSPITAL_COMMUNITY): Payer: Self-pay | Admitting: Neurosurgery

## 2019-07-24 ENCOUNTER — Other Ambulatory Visit: Payer: Self-pay

## 2019-07-24 ENCOUNTER — Inpatient Hospital Stay (HOSPITAL_COMMUNITY): Payer: Managed Care, Other (non HMO) | Admitting: Anesthesiology

## 2019-07-24 ENCOUNTER — Inpatient Hospital Stay (HOSPITAL_COMMUNITY)
Admission: RE | Admit: 2019-07-24 | Discharge: 2019-07-25 | DRG: 460 | Disposition: A | Discharge status: Home / Self Care | Payer: Managed Care, Other (non HMO) | Attending: Neurosurgery | Admitting: Neurosurgery

## 2019-07-24 ENCOUNTER — Encounter (HOSPITAL_COMMUNITY)
Admission: RE | Disposition: A | Discharge status: Home / Self Care | Payer: Self-pay | Source: Home / Self Care | Attending: Neurosurgery

## 2019-07-24 ENCOUNTER — Inpatient Hospital Stay (HOSPITAL_COMMUNITY): Payer: Managed Care, Other (non HMO)

## 2019-07-24 DIAGNOSIS — Z885 Allergy status to narcotic agent status: Secondary | ICD-10-CM

## 2019-07-24 DIAGNOSIS — M48061 Spinal stenosis, lumbar region without neurogenic claudication: Principal | ICD-10-CM | POA: Diagnosis present

## 2019-07-24 DIAGNOSIS — Z886 Allergy status to analgesic agent status: Secondary | ICD-10-CM

## 2019-07-24 DIAGNOSIS — F329 Major depressive disorder, single episode, unspecified: Secondary | ICD-10-CM | POA: Diagnosis present

## 2019-07-24 DIAGNOSIS — Z880 Allergy status to penicillin: Secondary | ICD-10-CM

## 2019-07-24 DIAGNOSIS — M48062 Spinal stenosis, lumbar region with neurogenic claudication: Principal | ICD-10-CM | POA: Diagnosis present

## 2019-07-24 DIAGNOSIS — F419 Anxiety disorder, unspecified: Secondary | ICD-10-CM | POA: Diagnosis present

## 2019-07-24 DIAGNOSIS — Z91040 Latex allergy status: Secondary | ICD-10-CM

## 2019-07-24 DIAGNOSIS — Z79899 Other long term (current) drug therapy: Secondary | ICD-10-CM

## 2019-07-24 DIAGNOSIS — M4726 Other spondylosis with radiculopathy, lumbar region: Secondary | ICD-10-CM | POA: Diagnosis present

## 2019-07-24 LAB — CBC
HCT: 44.9 % (ref 36.0–46.0)
Hemoglobin: 14.1 g/dL (ref 12.0–15.0)
MCH: 28.1 pg (ref 26.0–34.0)
MCHC: 31.4 g/dL (ref 30.0–36.0)
MCV: 89.6 fL (ref 80.0–100.0)
Platelets: 152 10*3/uL (ref 150–400)
RBC: 5.01 MIL/uL (ref 3.87–5.11)
RDW: 13.1 % (ref 11.5–15.5)
WBC: 5.4 10*3/uL (ref 4.0–10.5)
nRBC: 0 % (ref 0.0–0.2)

## 2019-07-24 LAB — TYPE AND SCREEN
ABO/RH(D): A POS
Antibody Screen: NEGATIVE

## 2019-07-24 SURGERY — POSTERIOR LUMBAR FUSION 1 LEVEL
Anesthesia: General

## 2019-07-24 MED ORDER — OXYCODONE HCL 5 MG PO TABS
ORAL_TABLET | ORAL | Status: AC
  Filled 2019-07-24: qty 2

## 2019-07-24 MED ORDER — ACETAMINOPHEN 10 MG/ML IV SOLN
INTRAVENOUS | Status: DC | PRN
  Administered 2019-07-24: 1000 mg via INTRAVENOUS

## 2019-07-24 MED ORDER — OXYCODONE HCL 5 MG PO TABS
5.0000 mg | ORAL_TABLET | Freq: Once | ORAL | Status: AC | PRN
  Administered 2019-07-24: 5 mg via ORAL

## 2019-07-24 MED ORDER — FENTANYL CITRATE (PF) 100 MCG/2ML IJ SOLN
25.0000 ug | INTRAMUSCULAR | Status: DC | PRN
  Administered 2019-07-24 (×2): 50 ug via INTRAVENOUS

## 2019-07-24 MED ORDER — SODIUM CHLORIDE 0.9% FLUSH: 3.0000 mL | INTRAVENOUS | Status: DC | PRN

## 2019-07-24 MED ORDER — FENTANYL CITRATE (PF) 250 MCG/5ML IJ SOLN
INTRAMUSCULAR | Status: DC | PRN
  Administered 2019-07-24: 50 ug via INTRAVENOUS
  Administered 2019-07-24: 25 ug via INTRAVENOUS
  Administered 2019-07-24 (×3): 50 ug via INTRAVENOUS
  Administered 2019-07-24: 25 ug via INTRAVENOUS

## 2019-07-24 MED ORDER — ONDANSETRON HCL 4 MG/2ML IJ SOLN
INTRAMUSCULAR | Status: DC | PRN
  Administered 2019-07-24: 4 mg via INTRAVENOUS

## 2019-07-24 MED ORDER — ACETAMINOPHEN 500 MG PO TABS: 1000.0000 mg | ORAL_TABLET | Freq: Once | ORAL | Status: DC

## 2019-07-24 MED ORDER — SUGAMMADEX SODIUM 200 MG/2ML IV SOLN
INTRAVENOUS | Status: DC | PRN
  Administered 2019-07-24: 200 mg via INTRAVENOUS

## 2019-07-24 MED ORDER — BISACODYL 5 MG PO TBEC: 5.0000 mg | DELAYED_RELEASE_TABLET | Freq: Every day | ORAL | Status: DC | PRN

## 2019-07-24 MED ORDER — PROPOFOL 10 MG/ML IV BOLUS
INTRAVENOUS | Status: AC
  Filled 2019-07-24: qty 20

## 2019-07-24 MED ORDER — BUPIVACAINE HCL (PF) 0.5 % IJ SOLN
INTRAMUSCULAR | Status: DC | PRN
  Administered 2019-07-24: 10 mL

## 2019-07-24 MED ORDER — SODIUM CHLORIDE 0.9% FLUSH: 3.0000 mL | Freq: Two times a day (BID) | INTRAVENOUS | Status: DC

## 2019-07-24 MED ORDER — FENTANYL CITRATE (PF) 100 MCG/2ML IJ SOLN
INTRAMUSCULAR | Status: AC
  Filled 2019-07-24: qty 2

## 2019-07-24 MED ORDER — SODIUM CHLORIDE 0.9 % IV SOLN: 250.0000 mL | INTRAVENOUS | Status: DC

## 2019-07-24 MED ORDER — PHENYLEPHRINE HCL (PRESSORS) 10 MG/ML IV SOLN
INTRAVENOUS | Status: DC | PRN
  Administered 2019-07-24: 80 ug via INTRAVENOUS

## 2019-07-24 MED ORDER — SENNOSIDES-DOCUSATE SODIUM 8.6-50 MG PO TABS: 1.0000 | ORAL_TABLET | Freq: Every evening | ORAL | Status: DC | PRN

## 2019-07-24 MED ORDER — CELECOXIB 200 MG PO CAPS: 200.0000 mg | ORAL_CAPSULE | Freq: Once | ORAL | Status: DC

## 2019-07-24 MED ORDER — CELECOXIB 200 MG PO CAPS
ORAL_CAPSULE | ORAL | Status: AC
  Administered 2019-07-24: 200 mg
  Filled 2019-07-24: qty 1

## 2019-07-24 MED ORDER — PHENOL 1.4 % MT LIQD: 1.0000 | OROMUCOSAL | Status: DC | PRN

## 2019-07-24 MED ORDER — ONDANSETRON HCL 4 MG/2ML IJ SOLN: 4.0000 mg | Freq: Four times a day (QID) | INTRAMUSCULAR | Status: DC | PRN

## 2019-07-24 MED ORDER — THROMBIN 20000 UNITS EX SOLR
CUTANEOUS | Status: DC | PRN
  Administered 2019-07-24: 20 mL via TOPICAL

## 2019-07-24 MED ORDER — MIDAZOLAM HCL 2 MG/2ML IJ SOLN
INTRAMUSCULAR | Status: AC
  Filled 2019-07-24: qty 2

## 2019-07-24 MED ORDER — DEXAMETHASONE SODIUM PHOSPHATE 10 MG/ML IJ SOLN
INTRAMUSCULAR | Status: DC | PRN
  Administered 2019-07-24: 10 mg via INTRAVENOUS

## 2019-07-24 MED ORDER — GABAPENTIN 300 MG PO CAPS: 300.0000 mg | ORAL_CAPSULE | Freq: Every day | ORAL | Status: DC | PRN

## 2019-07-24 MED ORDER — 0.9 % SODIUM CHLORIDE (POUR BTL) OPTIME
TOPICAL | Status: DC | PRN
  Administered 2019-07-24: 1000 mL

## 2019-07-24 MED ORDER — CHLORHEXIDINE GLUCONATE CLOTH 2 % EX PADS: 6.0000 | MEDICATED_PAD | Freq: Once | CUTANEOUS | Status: DC

## 2019-07-24 MED ORDER — OXYCODONE HCL ER 15 MG PO T12A
15.0000 mg | EXTENDED_RELEASE_TABLET | Freq: Two times a day (BID) | ORAL | Status: DC
  Administered 2019-07-24 – 2019-07-25 (×2): 15 mg via ORAL
  Filled 2019-07-24 (×2): qty 1

## 2019-07-24 MED ORDER — OXYCODONE HCL 5 MG/5ML PO SOLN: 5.0000 mg | Freq: Once | ORAL | Status: AC | PRN

## 2019-07-24 MED ORDER — LORAZEPAM 0.5 MG PO TABS: 1.0000 mg | ORAL_TABLET | Freq: Three times a day (TID) | ORAL | Status: DC | PRN

## 2019-07-24 MED ORDER — OXYCODONE HCL 5 MG PO TABS
10.0000 mg | ORAL_TABLET | ORAL | Status: DC | PRN
  Administered 2019-07-24 – 2019-07-25 (×2): 10 mg via ORAL
  Filled 2019-07-24 (×2): qty 2

## 2019-07-24 MED ORDER — ORAL CARE MOUTH RINSE: 15.0000 mL | Freq: Once | OROMUCOSAL | Status: DC

## 2019-07-24 MED ORDER — PHENYLEPHRINE HCL-NACL 10-0.9 MG/250ML-% IV SOLN
INTRAVENOUS | Status: DC | PRN
  Administered 2019-07-24: 25 ug/min via INTRAVENOUS

## 2019-07-24 MED ORDER — THROMBIN 20000 UNITS EX SOLR
CUTANEOUS | Status: AC
  Filled 2019-07-24: qty 20000

## 2019-07-24 MED ORDER — ONDANSETRON HCL 4 MG/2ML IJ SOLN: 4.0000 mg | Freq: Once | INTRAMUSCULAR | Status: DC | PRN

## 2019-07-24 MED ORDER — ONDANSETRON HCL 4 MG PO TABS: 4.0000 mg | ORAL_TABLET | Freq: Four times a day (QID) | ORAL | Status: DC | PRN

## 2019-07-24 MED ORDER — OXYCODONE HCL 5 MG PO TABS: 5.0000 mg | ORAL_TABLET | ORAL | Status: DC | PRN

## 2019-07-24 MED ORDER — THYROID 60 MG PO TABS
60.0000 mg | ORAL_TABLET | Freq: Every day | ORAL | Status: DC
  Administered 2019-07-25: 06:00:00 60 mg via ORAL
  Filled 2019-07-24: qty 1

## 2019-07-24 MED ORDER — SENNA 8.6 MG PO TABS
1.0000 | ORAL_TABLET | Freq: Two times a day (BID) | ORAL | Status: DC
  Administered 2019-07-24 – 2019-07-25 (×2): 8.6 mg via ORAL
  Filled 2019-07-24 (×2): qty 1

## 2019-07-24 MED ORDER — MENTHOL 3 MG MT LOZG
1.0000 | LOZENGE | OROMUCOSAL | Status: DC | PRN
  Filled 2019-07-24: qty 9

## 2019-07-24 MED ORDER — ACETAMINOPHEN 650 MG RE SUPP: 650.0000 mg | RECTAL | Status: DC | PRN

## 2019-07-24 MED ORDER — ESTRADIOL 1 MG PO TABS
1.0000 mg | ORAL_TABLET | Freq: Every day | ORAL | Status: DC
  Administered 2019-07-24 – 2019-07-25 (×2): 1 mg via ORAL
  Filled 2019-07-24 (×2): qty 1

## 2019-07-24 MED ORDER — CEFAZOLIN SODIUM-DEXTROSE 2-4 GM/100ML-% IV SOLN
2.0000 g | INTRAVENOUS | Status: AC
  Administered 2019-07-24: 2 g via INTRAVENOUS
  Filled 2019-07-24: qty 100

## 2019-07-24 MED ORDER — MAGNESIUM CITRATE PO SOLN: 1.0000 | Freq: Once | ORAL | Status: DC | PRN

## 2019-07-24 MED ORDER — FENTANYL CITRATE (PF) 250 MCG/5ML IJ SOLN
INTRAMUSCULAR | Status: AC
  Filled 2019-07-24: qty 5

## 2019-07-24 MED ORDER — PROPOFOL 10 MG/ML IV BOLUS
INTRAVENOUS | Status: DC | PRN
  Administered 2019-07-24: 160 mg via INTRAVENOUS

## 2019-07-24 MED ORDER — LIDOCAINE-EPINEPHRINE 0.5 %-1:200000 IJ SOLN: INTRAMUSCULAR | Status: DC | PRN

## 2019-07-24 MED ORDER — MORPHINE SULFATE (PF) 2 MG/ML IV SOLN
2.0000 mg | INTRAVENOUS | Status: DC | PRN
  Administered 2019-07-24: 2 mg via INTRAVENOUS
  Filled 2019-07-24: qty 1

## 2019-07-24 MED ORDER — CHLORHEXIDINE GLUCONATE 0.12 % MT SOLN: 15.0000 mL | Freq: Once | OROMUCOSAL | Status: DC

## 2019-07-24 MED ORDER — ROCURONIUM BROMIDE 10 MG/ML (PF) SYRINGE
PREFILLED_SYRINGE | INTRAVENOUS | Status: DC | PRN
  Administered 2019-07-24: 20 mg via INTRAVENOUS
  Administered 2019-07-24: 70 mg via INTRAVENOUS
  Administered 2019-07-24: 20 mg via INTRAVENOUS

## 2019-07-24 MED ORDER — ACETAMINOPHEN 325 MG PO TABS
650.0000 mg | ORAL_TABLET | ORAL | Status: DC | PRN
  Administered 2019-07-24 – 2019-07-25 (×2): 650 mg via ORAL
  Filled 2019-07-24 (×2): qty 2

## 2019-07-24 MED ORDER — DIAZEPAM 5 MG PO TABS
5.0000 mg | ORAL_TABLET | Freq: Four times a day (QID) | ORAL | Status: DC | PRN
  Administered 2019-07-24 – 2019-07-25 (×2): 5 mg via ORAL
  Filled 2019-07-24 (×2): qty 1

## 2019-07-24 MED ORDER — ZOLPIDEM TARTRATE 5 MG PO TABS: 5.0000 mg | ORAL_TABLET | Freq: Every evening | ORAL | Status: DC | PRN

## 2019-07-24 MED ORDER — MIDAZOLAM HCL 2 MG/2ML IJ SOLN
INTRAMUSCULAR | Status: DC | PRN
  Administered 2019-07-24: 2 mg via INTRAVENOUS

## 2019-07-24 MED ORDER — POTASSIUM CHLORIDE IN NACL 20-0.9 MEQ/L-% IV SOLN: INTRAVENOUS | Status: DC

## 2019-07-24 MED ORDER — LIDOCAINE 2% (20 MG/ML) 5 ML SYRINGE
INTRAMUSCULAR | Status: DC | PRN
  Administered 2019-07-24: 60 mg via INTRAVENOUS

## 2019-07-24 MED ORDER — LACTATED RINGERS IV SOLN: INTRAVENOUS | Status: DC

## 2019-07-24 MED ORDER — ROPINIROLE HCL 1 MG PO TABS
2.0000 mg | ORAL_TABLET | Freq: Every day | ORAL | Status: DC
  Administered 2019-07-24: 21:00:00 2 mg via ORAL
  Filled 2019-07-24: qty 2

## 2019-07-24 MED ORDER — BUPIVACAINE HCL (PF) 0.5 % IJ SOLN
INTRAMUSCULAR | Status: AC
  Filled 2019-07-24: qty 30

## 2019-07-24 MED ORDER — ACETAMINOPHEN 500 MG PO TABS
ORAL_TABLET | ORAL | Status: AC
  Administered 2019-07-24: 11:00:00 1000 mg
  Filled 2019-07-24: qty 2

## 2019-07-24 MED ORDER — LIDOCAINE-EPINEPHRINE 0.5 %-1:200000 IJ SOLN
INTRAMUSCULAR | Status: AC
  Filled 2019-07-24: qty 1

## 2019-07-24 MED ORDER — TRAZODONE HCL 150 MG PO TABS
150.0000 mg | ORAL_TABLET | Freq: Every day | ORAL | Status: DC
  Administered 2019-07-24: 21:00:00 150 mg via ORAL
  Filled 2019-07-24: qty 1

## 2019-07-24 SURGICAL SUPPLY — 63 items
ADH SKN CLS APL DERMABOND .7 (GAUZE/BANDAGES/DRESSINGS) ×1
BASKET BONE COLLECTION (BASKET) ×2 IMPLANT
BIT DRILL PLIF MAS DISP 5.5MM (DRILL) IMPLANT
BUR MATCHSTICK NEURO 3.0 LAGG (BURR) ×3 IMPLANT
BUR PRECISION FLUTE 5.0 (BURR) ×3 IMPLANT
CANISTER SUCT 3000ML PPV (MISCELLANEOUS) ×3 IMPLANT
CARTRIDGE OIL MAESTRO DRILL (MISCELLANEOUS) ×1 IMPLANT
CLOSURE WOUND 1/2 X4 (GAUZE/BANDAGES/DRESSINGS)
CNTNR URN SCR LID CUP LEK RST (MISCELLANEOUS) ×1 IMPLANT
CONT SPEC 4OZ STRL OR WHT (MISCELLANEOUS) ×3
COVER BACK TABLE 60X90IN (DRAPES) ×3 IMPLANT
COVER WAND RF STERILE (DRAPES) ×3 IMPLANT
DECANTER SPIKE VIAL GLASS SM (MISCELLANEOUS) ×3 IMPLANT
DERMABOND ADVANCED (GAUZE/BANDAGES/DRESSINGS) ×2
DERMABOND ADVANCED .7 DNX12 (GAUZE/BANDAGES/DRESSINGS) ×1 IMPLANT
DIFFUSER DRILL AIR PNEUMATIC (MISCELLANEOUS) ×3 IMPLANT
DRAPE C-ARM 42X72 X-RAY (DRAPES) ×6 IMPLANT
DRAPE C-ARMOR (DRAPES) IMPLANT
DRAPE LAPAROTOMY 100X72X124 (DRAPES) ×3 IMPLANT
DRAPE SURG 17X23 STRL (DRAPES) ×3 IMPLANT
DRILL PLIF MAS DISP 5.5MM (DRILL) ×3
DRSG OPSITE POSTOP 4X8 (GAUZE/BANDAGES/DRESSINGS) ×2 IMPLANT
DURAPREP 26ML APPLICATOR (WOUND CARE) ×3 IMPLANT
ELECT REM PT RETURN 9FT ADLT (ELECTROSURGICAL) ×3
ELECTRODE REM PT RTRN 9FT ADLT (ELECTROSURGICAL) ×1 IMPLANT
GAUZE 4X4 16PLY RFD (DISPOSABLE) IMPLANT
GAUZE SPONGE 4X4 12PLY STRL (GAUZE/BANDAGES/DRESSINGS) IMPLANT
GAUZE SPONGE 4X4 16PLY XRAY LF (GAUZE/BANDAGES/DRESSINGS) ×2 IMPLANT
GLOVE ECLIPSE 6.5 STRL STRAW (GLOVE) ×6 IMPLANT
GLOVE EXAM NITRILE XL STR (GLOVE) IMPLANT
GLOVE SURG SS PI 6.5 STRL IVOR (GLOVE) ×6 IMPLANT
GOWN STRL REUS W/ TWL LRG LVL3 (GOWN DISPOSABLE) ×2 IMPLANT
GOWN STRL REUS W/ TWL XL LVL3 (GOWN DISPOSABLE) IMPLANT
GOWN STRL REUS W/TWL 2XL LVL3 (GOWN DISPOSABLE) IMPLANT
GOWN STRL REUS W/TWL LRG LVL3 (GOWN DISPOSABLE) ×6
GOWN STRL REUS W/TWL XL LVL3 (GOWN DISPOSABLE)
GRAFT BN 5X1XSPNE CVD POST DBM (Bone Implant) IMPLANT
GRAFT BONE MAGNIFUSE 1X5CM (Bone Implant) ×3 IMPLANT
KIT BASIN OR (CUSTOM PROCEDURE TRAY) ×3 IMPLANT
KIT POSITION SURG JACKSON T1 (MISCELLANEOUS) ×3 IMPLANT
KIT TURNOVER KIT B (KITS) ×3 IMPLANT
MILL MEDIUM DISP (BLADE) IMPLANT
NDL HYPO 25X1 1.5 SAFETY (NEEDLE) ×1 IMPLANT
NEEDLE HYPO 25X1 1.5 SAFETY (NEEDLE) ×3 IMPLANT
NS IRRIG 1000ML POUR BTL (IV SOLUTION) ×3 IMPLANT
OIL CARTRIDGE MAESTRO DRILL (MISCELLANEOUS) ×3
PACK LAMINECTOMY NEURO (CUSTOM PROCEDURE TRAY) ×3 IMPLANT
ROD 60MM LUMBAR (Rod) ×4 IMPLANT
SCREW LOCK (Screw) ×18 IMPLANT
SCREW LOCK FXNS SPNE MAS PL (Screw) IMPLANT
SCREW SHANKS 5.5X35 (Screw) ×4 IMPLANT
SCREW TULIP 5.5 (Screw) ×4 IMPLANT
SPONGE LAP 4X18 RFD (DISPOSABLE) IMPLANT
SPONGE SURGIFOAM ABS GEL 100 (HEMOSTASIS) ×3 IMPLANT
STRIP CLOSURE SKIN 1/2X4 (GAUZE/BANDAGES/DRESSINGS) IMPLANT
SUT VIC AB 0 CT1 18XCR BRD8 (SUTURE) ×1 IMPLANT
SUT VIC AB 0 CT1 8-18 (SUTURE) ×3
SUT VIC AB 2-0 CT1 18 (SUTURE) ×3 IMPLANT
SUT VIC AB 3-0 SH 8-18 (SUTURE) ×3 IMPLANT
TOWEL GREEN STERILE (TOWEL DISPOSABLE) ×3 IMPLANT
TOWEL GREEN STERILE FF (TOWEL DISPOSABLE) ×3 IMPLANT
TRAY FOLEY MTR SLVR 16FR STAT (SET/KITS/TRAYS/PACK) ×3 IMPLANT
WATER STERILE IRR 1000ML POUR (IV SOLUTION) ×3 IMPLANT

## 2019-07-24 NOTE — H&P (Signed)
BP 126/76   Pulse 87   Temp 97.7 F (36.5 C) (Oral)   Resp 18   Ht 5\' 5"  (1.651 m)   Wt 88.5 kg   LMP  (LMP Unknown)   SpO2 100%   BMI 32.45 kg/m    Stacy Villanueva comes in today looking even worse than she did at her last visit.  She cannot stand up straight.  She walks bent over at about 60 degrees.  She is in significant discomfort.  Stacy Villanueva's vitals today are as follows:  Height 66 inches, weight 198 pounds, BMI 32.05, blood pressure 162/92, pulse is 111, temperature is 97.5.  She takes Ativan, Citalopram, Cyclobenzaprine, Diazepam, Gabapentin, Oxycodone, Requip, Tramadol, Tylenol Extra Strength.     ALLERGIC :  To Codeine and Penicillin.  She has had no reaction whatsoever to the Oxycodone.     EXAM :  Today, reflexes elicited at the knees, trace at the right ankle, 1+ at the left ankle.  Normal muscle tone and bulk.  Gait is markedly antalgic.  She is in a great deal of discomfort.  Pain would be rated as 10/10.  I did elicit significant pain with passive flexion and extension of the right hip.  I will just do some plain x-rays today to make sure that the hip is not a source of the pain.  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  She says today that after she had her operation, she felt good, but she felt good only for about 3 months.  She does have a degenerative scoliosis present in the lumbar spine in addition to the stenosis and neurogenic claudication.       She has decided she would like to proceed with operative decompression and arthrodesis.  I would plan on extending the arthrodesis, decompressing right L4-5 in the same manner as I had done previously via a posterior lumbar interbody arthrodesis.  She understands the risks, having had the procedure and wishes to proceed.  We will try to get this done Friday.

## 2019-07-24 NOTE — Anesthesia Procedure Notes (Signed)
Procedure Name: Intubation Date/Time: 07/24/2019 3:00 PM Performed by: Clearnce Sorrel, CRNA Pre-anesthesia Checklist: Patient identified, Emergency Drugs available, Suction available, Patient being monitored and Timeout performed Patient Re-evaluated:Patient Re-evaluated prior to induction Oxygen Delivery Method: Circle system utilized Preoxygenation: Pre-oxygenation with 100% oxygen Induction Type: IV induction Ventilation: Mask ventilation without difficulty Laryngoscope Size: Mac and 3 Grade View: Grade I Tube type: Oral Tube size: 7.0 mm Number of attempts: 1 Airway Equipment and Method: Stylet Placement Confirmation: ETT inserted through vocal cords under direct vision,  positive ETCO2 and breath sounds checked- equal and bilateral Secured at: 23 cm Tube secured with: Tape Dental Injury: Teeth and Oropharynx as per pre-operative assessment

## 2019-07-24 NOTE — Transfer of Care (Signed)
Immediate Anesthesia Transfer of Care Note  Patient: Stacy Villanueva  Procedure(s) Performed: Lumbar 4-5 Posterior lumbar interbody fusion (N/A )  Patient Location: PACU  Anesthesia Type:General  Level of Consciousness: awake, alert  and oriented  Airway & Oxygen Therapy: Patient Spontanous Breathing and Patient connected to nasal cannula oxygen  Post-op Assessment: Report given to RN and Post -op Vital signs reviewed and stable  Post vital signs: Reviewed and stable  Last Vitals:  Vitals Value Taken Time  BP 122/90 07/24/19 1835  Temp    Pulse 116 07/24/19 1838  Resp 16 07/24/19 1838  SpO2 100 % 07/24/19 1838  Vitals shown include unvalidated device data.  Last Pain:  Vitals:   07/24/19 1054  TempSrc:   PainSc: 7       Patients Stated Pain Goal: 3 ([id number])  Complications: No apparent anesthesia complications

## 2019-07-24 NOTE — Anesthesia Preprocedure Evaluation (Addendum)
Anesthesia Evaluation  Patient identified by MRN, date of birth, ID band Patient awake    Reviewed: Allergy & Precautions, NPO status , Patient's Chart, lab work & pertinent test results  History of Anesthesia Complications Negative for: history of anesthetic complications  Airway Mallampati: II  TM Distance: >3 FB Neck ROM: Full    Dental no notable dental hx. (+) Dental Advisory Given   Pulmonary neg pulmonary ROS,    Pulmonary exam normal        Cardiovascular negative cardio ROS Normal cardiovascular exam     Neuro/Psych PSYCHIATRIC DISORDERS Anxiety Depression negative neurological ROS     GI/Hepatic Neg liver ROS, GERD  Poorly Controlled,  Endo/Other  Hypothyroidism   Renal/GU negative Renal ROS     Musculoskeletal   Abdominal (+) - obese,   Peds  Hematology negative hematology ROS (+)   Anesthesia Other Findings   Reproductive/Obstetrics                            Anesthesia Physical  Anesthesia Plan  ASA: II  Anesthesia Plan: General   Post-op Pain Management:    Induction: Intravenous  PONV Risk Score and Plan: 3 and Ondansetron, Dexamethasone and Midazolam  Airway Management Planned: Oral ETT  Additional Equipment:   Intra-op Plan:   Post-operative Plan: Extubation in OR  Informed Consent: I have reviewed the patients History and Physical, chart, labs and discussed the procedure including the risks, benefits and alternatives for the proposed anesthesia with the patient or authorized representative who has indicated his/her understanding and acceptance.     Dental advisory given  Plan Discussed with: Anesthesiologist and CRNA  Anesthesia Plan Comments: ( )       Anesthesia Quick Evaluation

## 2019-07-25 DIAGNOSIS — Z886 Allergy status to analgesic agent status: Secondary | ICD-10-CM | POA: Diagnosis not present

## 2019-07-25 DIAGNOSIS — Z79899 Other long term (current) drug therapy: Secondary | ICD-10-CM | POA: Diagnosis not present

## 2019-07-25 DIAGNOSIS — Z880 Allergy status to penicillin: Secondary | ICD-10-CM | POA: Diagnosis not present

## 2019-07-25 DIAGNOSIS — F419 Anxiety disorder, unspecified: Secondary | ICD-10-CM | POA: Diagnosis present

## 2019-07-25 DIAGNOSIS — Z91040 Latex allergy status: Secondary | ICD-10-CM | POA: Diagnosis not present

## 2019-07-25 DIAGNOSIS — F329 Major depressive disorder, single episode, unspecified: Secondary | ICD-10-CM | POA: Diagnosis present

## 2019-07-25 DIAGNOSIS — M4726 Other spondylosis with radiculopathy, lumbar region: Secondary | ICD-10-CM | POA: Diagnosis present

## 2019-07-25 DIAGNOSIS — Z885 Allergy status to narcotic agent status: Secondary | ICD-10-CM | POA: Diagnosis not present

## 2019-07-25 DIAGNOSIS — M48062 Spinal stenosis, lumbar region with neurogenic claudication: Secondary | ICD-10-CM | POA: Diagnosis present

## 2019-07-25 MED ORDER — HYDROCODONE-ACETAMINOPHEN 5-325 MG PO TABS
1.0000 | ORAL_TABLET | ORAL | Status: DC | PRN
  Administered 2019-07-25 (×2): 2 via ORAL
  Filled 2019-07-25 (×2): qty 2

## 2019-07-25 MED ORDER — HYDROCODONE-ACETAMINOPHEN 5-325 MG PO TABS: 1.0000 | ORAL_TABLET | ORAL | 0 refills | Status: DC | PRN

## 2019-07-25 MED ORDER — DIAZEPAM 5 MG PO TABS: 5.0000 mg | ORAL_TABLET | Freq: Four times a day (QID) | ORAL | 0 refills | Status: DC | PRN

## 2019-07-25 NOTE — Evaluation (Signed)
Physical Therapy Evaluation & Discharge Patient Details Name: Stacy Villanueva MRN: [id number] DOB: 04-27-1970 Today's Date: 07/25/2019   History of Present Illness  Pt is a 49 yo female s/p PLIF L4-5. PMHx: degenerative scoliosis, anxiety, depression.  Clinical Impression  Pt presented supine in bed with HOB elevated, awake and willing to participate in therapy session. Prior to admission, pt reported that she was independent with all functional mobility and ADLs. Pt lives with her spouse in a two level home with a level entry. She works as an Therapist, sports. At the time of evaluation, pt overall at a mod I level with all functional mobility without use of an AD. She tolerated hallway ambulation and stair training without difficulties. PT provided pt education re: back precautions, car transfers and a generalized walking program for pt to initiate upon d/c home. Plan is for pt to d/c home today with spouse. Pt is ready to d/c home from a PT perspective.     Follow Up Recommendations No PT follow up    Equipment Recommendations  None recommended by PT    Recommendations for Other Services       Precautions / Restrictions Precautions Precautions: Back Precaution Booklet Issued: Yes (comment) Precaution Comments: reviewed all back precautions with pt throughout Required Braces or Orthoses: ("no brace needed" MD order) Restrictions Weight Bearing Restrictions: No      Mobility  Bed Mobility Overal bed mobility: Modified Independent             General bed mobility comments: cueing for log roll  Transfers Overall transfer level: Modified independent Equipment used: None                Ambulation/Gait Ambulation/Gait assistance: Modified independent (Device/Increase time) Gait Distance (Feet): 400 Feet Assistive device: None Gait Pattern/deviations: Step-through pattern Gait velocity: WFL   General Gait Details: no instability or LOB  Stairs Stairs: Yes Stairs assistance:  Supervision Stair Management: One rail Left;Step to pattern;Forwards Number of Stairs: 3 General stair comments: no instability or LOB  Wheelchair Mobility    Modified Rankin (Stroke Patients Only)       Balance Overall balance assessment: No apparent balance deficits (not formally assessed)                                           Pertinent Vitals/Pain Pain Assessment: Faces Faces Pain Scale: Hurts little more Pain Location: back Pain Descriptors / Indicators: Guarding Pain Intervention(s): Monitored during session;Repositioned    Home Living Family/patient expects to be discharged to:: Private residence Living Arrangements: Spouse/significant other Available Help at Discharge: Family Type of Home: House Home Access: Level entry     Home Layout: Able to live on main level with bedroom/bathroom Home Equipment: None      Prior Function Level of Independence: Independent         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        Extremity/Trunk Assessment   Upper Extremity Assessment Upper Extremity Assessment: Defer to OT evaluation;Overall WFL for tasks assessed    Lower Extremity Assessment Lower Extremity Assessment: Overall WFL for tasks assessed    Cervical / Trunk Assessment Cervical / Trunk Assessment: Other exceptions Cervical / Trunk Exceptions: s/p lumbar sx  Communication   Communication: No difficulties  Cognition Arousal/Alertness: Awake/alert Behavior During Therapy: WFL for tasks assessed/performed Overall Cognitive Status: Within Functional  Limits for tasks assessed                                        General Comments      Exercises     Assessment/Plan    PT Assessment Patent does not need any further PT services  PT Problem List         PT Treatment Interventions      PT Goals (Current goals can be found in the Care Plan section)  Acute Rehab PT Goals Patient Stated Goal: "home  today" PT Goal Formulation: All assessment and education complete, DC therapy    Frequency     Barriers to discharge        Co-evaluation               AM-PAC PT "6 Clicks" Mobility  Outcome Measure Help needed turning from your back to your side while in a flat bed without using bedrails?: None Help needed moving from lying on your back to sitting on the side of a flat bed without using bedrails?: None Help needed moving to and from a bed to a chair (including a wheelchair)?: None Help needed standing up from a chair using your arms (e.g., wheelchair or bedside chair)?: None Help needed to walk in hospital room?: None Help needed climbing 3-5 steps with a railing? : None 6 Click Score: 24    End of Session   Activity Tolerance: Patient tolerated treatment well Patient left: with call bell/phone within reach;in bed;Other (comment)(sitting upright at EOB) Nurse Communication: Mobility status PT Visit Diagnosis: Other abnormalities of gait and mobility (R26.89);Pain Pain - part of body: (back)    Time: [id number] PT Time Calculation (min) (ACUTE ONLY): 44 min   Charges:   PT Evaluation $PT Eval Moderate Complexity: 1 Mod PT Treatments $Gait Training: 8-22 mins $Therapeutic Activity: 8-22 mins        Anastasio Champion, DPT  Acute Rehabilitation Services Pager [phone] Office [place] 07/25/2019, 9:55 AM

## 2019-07-25 NOTE — Anesthesia Postprocedure Evaluation (Signed)
Anesthesia Post Note  Patient: Stacy Villanueva  Procedure(s) Performed: Lumbar 4-5 Posterior lumbar interbody fusion (N/A )     Patient location during evaluation: PACU Anesthesia Type: General Level of consciousness: awake and alert Pain management: pain level controlled Vital Signs Assessment: post-procedure vital signs reviewed and stable Respiratory status: spontaneous breathing, nonlabored ventilation, respiratory function stable and patient connected to nasal cannula oxygen Cardiovascular status: blood pressure returned to baseline and stable Postop Assessment: no apparent nausea or vomiting Anesthetic complications: no    Last Vitals:  Vitals:   07/24/19 1950 07/24/19 2346  BP: (!) 143/64 (!) 104/58  Pulse: (!) 105 81  Resp: 18 18  Temp: 36.8 C 36.9 C  SpO2: 99% 95%    Last Pain:  Vitals:   07/25/19 0048  TempSrc:   PainSc: Asleep                 [name]

## 2019-07-25 NOTE — Discharge Instructions (Signed)

## 2019-07-25 NOTE — Plan of Care (Signed)
Patient alert and oriented, mae's well, voiding adequate amount of urine, swallowing without difficulty, no c/o pain at time of discharge. Patient discharged home with family. Script and discharged instructions given to patient. Patient and family stated understanding of instructions given. Patient has an appointment with Dr. Cabbell   

## 2019-07-25 NOTE — Progress Notes (Signed)
Patient ID: Stacy Villanueva, female   DOB: 10/23/1970, 49 y.o.   MRN: [id number] Vital signs are stable Motor function appears quite good Incision is clean and dry Patient is ready for discharge

## 2019-07-25 NOTE — Evaluation (Signed)
Occupational Therapy Evaluation Patient Details Name: Stacy Villanueva MRN: [id number] DOB: 30-Mar-1970 Today's Date: 07/25/2019    History of Present Illness Pt is a 49 yo female s/p PLIF L4-5. PMHx: degenerative scoliosis, anxiety, depression.   Clinical Impression   Pt PTA: Living independently with family; working as Therapist, sports. Pt currently performing ADL with modified independence and mobility in room with modified independence. Pt performing ADL tasks with figure 4 technique/hip hike with no difficulty. Pain reported in low back. Back handout provided and reviewed adls in detail. Pt educated on: set an alarm at night for medication, avoid sitting for long periods of time, correct bed positioning for sleeping, correct sequence for bed mobility, avoiding lifting more than 5 pounds and never wash directly over incision. All education is complete and patient indicates understanding. Pt does not require continued OT skilled services. OT signing off.    Follow Up Recommendations  No OT follow up    Equipment Recommendations  None recommended by OT    Recommendations for Other Services       Precautions / Restrictions Precautions Precautions: Back Precaution Booklet Issued: Yes (comment) Precaution Comments: reviewed all back precautions and ADL tasks; pt able to state 3/3 precautions Required Braces or Orthoses: Other Brace("no brace needed") Restrictions Weight Bearing Restrictions: No      Mobility Bed Mobility Overal bed mobility: Modified Independent             General bed mobility comments: cueing for log roll  Transfers Overall transfer level: Modified independent Equipment used: None                  Balance Overall balance assessment: No apparent balance deficits (not formally assessed)                                         ADL either performed or assessed with clinical judgement   ADL Overall ADL's : At baseline;Modified independent                                       General ADL Comments: Pt performing figure 4 technique for LB ADL; sit to stands with      Vision Baseline Vision/History: Wears glasses Wears Glasses: At all times Patient Visual Report: No change from baseline Vision Assessment?: No apparent visual deficits     Perception     Praxis      Pertinent Vitals/Pain Pain Assessment: Faces Faces Pain Scale: Hurts a little bit Pain Location: back Pain Descriptors / Indicators: Guarding Pain Intervention(s): Monitored during session     Hand Dominance Right   Extremity/Trunk Assessment Upper Extremity Assessment Upper Extremity Assessment: Overall WFL for tasks assessed   Lower Extremity Assessment Lower Extremity Assessment: Overall WFL for tasks assessed   Cervical / Trunk Assessment Cervical / Trunk Assessment: Other exceptions Cervical / Trunk Exceptions: s/p lumbar sx   Communication Communication Communication: No difficulties   Cognition Arousal/Alertness: Awake/alert Behavior During Therapy: WFL for tasks assessed/performed Overall Cognitive Status: Within Functional Limits for tasks assessed                                     General Comments  Pt education on AE available, but pt has supportive  spouse and does not require AE at this time.    Exercises     Shoulder Instructions      Home Living Family/patient expects to be discharged to:: Private residence Living Arrangements: Spouse/significant other Available Help at Discharge: Family Type of Home: House Home Access: Level entry     Home Layout: Able to live on main level with bedroom/bathroom     Bathroom Shower/Tub: Tub/shower unit;Walk-in shower   Bathroom Toilet: Handicapped height     Home Equipment: None          Prior Functioning/Environment Level of Independence: Independent        Comments: works as an Medical illustrator Problem List: Decreased activity  tolerance;Pain      OT Treatment/Interventions:      OT Goals(Current goals can be found in the care plan section) Acute Rehab OT Goals Patient Stated Goal: "home today"  OT Frequency:     Barriers to D/C:            Co-evaluation              AM-PAC OT "6 Clicks" Daily Activity     Outcome Measure Help from another person eating meals?: None Help from another person taking care of personal grooming?: None Help from another person toileting, which includes using toliet, bedpan, or urinal?: None Help from another person bathing (including washing, rinsing, drying)?: A Little Help from another person to put on and taking off regular upper body clothing?: None Help from another person to put on and taking off regular lower body clothing?: None 6 Click Score: 23   End of Session Nurse Communication: Mobility status;Precautions  Activity Tolerance: Patient tolerated treatment well Patient left: in bed;with call bell/phone within reach  OT Visit Diagnosis: Unsteadiness on feet (R26.81);Muscle weakness (generalized) (M62.81);Pain Pain - part of body: (back)                Time: [id number] OT Time Calculation (min): 22 min Charges:  OT General Charges $OT Visit: 1 Visit OT Evaluation $OT Eval Moderate Complexity: 1 Mod  Jefferey Pica, OTR/L Acute Rehabilitation Services Pager: [phone] Office: [phone]   Malyia Moro C 07/25/2019, 10:22 AM

## 2019-07-25 NOTE — Discharge Summary (Signed)
Physician Discharge Summary  Patient ID: Stacy Villanueva MRN: [id number] DOB/AGE: January 15, 1971 49 y.o.  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  History of lumbar fusion L5-S1 Active Problems:   Bilateral stenosis of lateral recess of lumbar spine   Discharged Condition: good  Hospital Course: Patient was admitted to undergo surgical decompression arthrodesis at L4-L5 having had previous surgery at L5-S1.  She tolerated surgery well  Consults: None  Significant Diagnostic Studies: None  Treatments: surgery: Decompression L4-L5 posterior lumbar interbody arthrodesis L4-L5.  Discharge Exam: Blood pressure 98/66, pulse 74, temperature 98.2 F (36.8 C), temperature source Oral, resp. rate 18, height 5\' 5"  (1.651 m), weight 88.5 kg, SpO2 98 %. Incisions clean and dry motor function is intact  Disposition: Discharge disposition: 01-Home or Self Care       Discharge Instructions    Call MD for:  redness, tenderness, or signs of infection (pain, swelling, redness, odor or green/yellow discharge around incision site)   Complete by: As directed    Call MD for:  severe uncontrolled pain   Complete by: As directed    Call MD for:  temperature >100.4   Complete by: As directed    Diet - low sodium heart healthy   Complete by: As directed    Increase activity slowly   Complete by: As directed      Allergies as of 07/25/2019      Reactions   Meloxicam Other (See Comments)   Severe headache   Latex Itching, Rash      Medication List    TAKE these medications   acetaminophen 500 MG tablet Commonly known as: TYLENOL Take 1,000 mg by mouth every 6 (six) hours as needed for mild pain or moderate pain.   cyclobenzaprine 10 MG tablet Commonly known as: FLEXERIL Take 10 mg by mouth 3 (three)  times daily as needed for muscle spasms.   diazepam 5 MG tablet Commonly known as: VALIUM Take 1 tablet (5 mg total) by mouth every 6 (six) hours as needed for muscle spasms.   docusate sodium 50 MG capsule Commonly known as: COLACE Take 50 mg by mouth daily.   estradiol 1 MG tablet Commonly known as: ESTRACE Take 1 mg by mouth daily.   gabapentin 300 MG capsule Commonly known as: Neurontin Take 1 capsule (300 mg total) by mouth 3 (three) times daily for 10 days. What changed:   when to take this  reasons to take this   HYDROcodone-acetaminophen 5-325 MG tablet Commonly known as: NORCO/VICODIN Take 1-2 tablets by mouth every 4 (four) hours as needed for moderate pain or severe pain.   LORazepam 1 MG tablet Commonly known as: ATIVAN Take 1 mg by mouth 3 (three) times daily as needed for anxiety.   oxyCODONE 5 MG immediate release tablet Commonly known as: Roxicodone Take 1 tablet (5 mg total) by mouth every 4 (four) hours as needed for severe pain. What changed: when to take this   rOPINIRole 2 MG tablet Commonly known as: REQUIP Take 2 mg by mouth at bedtime.   thyroid 60 MG tablet Commonly known as: ARMOUR Take 60 mg by mouth daily before breakfast.   traZODone 150 MG tablet Commonly known as: DESYREL Take 150 mg by mouth at bedtime.      Follow-up Information    Ashok Pall, MD Follow up.   Specialty: Neurosurgery Contact information:  [address] [place] [place] [address] [phone]           Signed: Earleen Newport 07/25/2019, 9:34 AM

## 2019-07-28 NOTE — Op Note (Signed)
07/24/2019  1:01 PM  PATIENT:  Stacy Villanueva  49 y.o. female with lateral recess stenosis, and facet arthropathy. Conservative measures have not proven beneficial. Stacy Villanueva has opted for operative decompression.   PRE-OPERATIVE DIAGNOSIS:  Lumbar stenosis with neurogenic claudication, lateral recess stenosis L4/5,   POST-OPERATIVE DIAGNOSIS: same PROCEDURE:  Procedure(s):  Laminectomy L4/5 to decompress the spinal canal, lateral recesses and the L4, and L5 nerve roots segmental pedicle screw fixation L4-S1, Nuvasive mas Plif Posterolateral arthrodesis autograft morsels(bone in a bag) SURGEON:  Surgeon(s): Ashok Pall, MD Vallarie Mare, MD  ASSISTANTS:Thomas, Roderic Palau  ANESTHESIA:   general  EBL:  No intake/output data recorded.  BLOOD ADMINISTERED:none  CELL SAVER GIVEN:none  COUNT:per nursing  DRAINS: none   SPECIMEN:  No Specimen  DICTATION: Stacy Villanueva is a 49 y.o. female whom was taken to the operating room intubated, and placed under a general anesthetic without difficulty. A foley catheter was placed under sterile conditions. She was positioned prone on a Jackson stable with all pressure points properly padded.  Her lumbar region was prepped and draped in a sterile manner. I infiltrated 10cc's 1/2%lidocaine/1:2000,000 strength epinephrine into the planned incision. I opened the skin with a 10 blade and took the incision down to the thoracolumbar fascia. I exposed the residual lamina of L4 and L5 in a subperiosteal fashion bilaterally. I confirmed my location with an intraoperative xray.  I placed self retaining retractors and started the decompression.  I decompressed the spinal canal via a near complete laminectomy of L4. I removed scar tissue and exposed the lateral recesses by performing inferior facetectomies of L4, and partial facetectomies of L5.  I decorticated the lateral bone at L4, and L5. I then placed allograft morsels and autograft on the decorticated  surfaces to complete the posterolateral arthrodesis.  We placed pedicle screws at L4, using fluoroscopic guidance. I drilled a pilot hole, then cannulated the pedicle with a drill at each site. We then tapped each pedicle, assessing each site for pedicle violations. No cutouts were appreciated. Screws (nuvasive mas plif) were then placed at each site without difficulty. We attached rods and locking caps with the appropriate tools. The locking caps were secured with torque limited screwdrivers. Final films were performed and the final construct appeared to be in good position.  I closed the wound in a layered fashion. I approximated the thoracolumbar fascia, subcutaneous, and subcuticular planes with vicryl sutures. I used dermabond and an occlusive bandage for a sterile dressing.     PLAN OF CARE: Admit to inpatient   PATIENT DISPOSITION:  PACU - hemodynamically stable.   Delay start of Pharmacological VTE agent (>24hrs) due to surgical blood loss or risk of bleeding:  yes

## 2019-10-13 ENCOUNTER — Other Ambulatory Visit: Payer: Self-pay | Admitting: Neurosurgery

## 2019-10-13 DIAGNOSIS — M48062 Spinal stenosis, lumbar region with neurogenic claudication: Secondary | ICD-10-CM

## 2019-10-14 ENCOUNTER — Ambulatory Visit (HOSPITAL_COMMUNITY): Payer: Managed Care, Other (non HMO)

## 2019-10-14 ENCOUNTER — Encounter (HOSPITAL_COMMUNITY): Payer: Self-pay

## 2019-10-16 ENCOUNTER — Other Ambulatory Visit: Payer: Self-pay | Admitting: Neurosurgery

## 2019-10-16 ENCOUNTER — Other Ambulatory Visit (HOSPITAL_COMMUNITY): Payer: Self-pay | Admitting: Neurosurgery

## 2019-10-16 DIAGNOSIS — M48062 Spinal stenosis, lumbar region with neurogenic claudication: Secondary | ICD-10-CM

## 2019-10-19 ENCOUNTER — Ambulatory Visit (HOSPITAL_COMMUNITY)
Admission: RE | Admit: 2019-10-19 | Discharge: 2019-10-19 | Disposition: A | Discharge status: Home / Self Care | Payer: Managed Care, Other (non HMO) | Source: Ambulatory Visit | Attending: Neurosurgery | Admitting: Neurosurgery

## 2019-10-19 ENCOUNTER — Other Ambulatory Visit: Payer: Self-pay

## 2019-10-19 DIAGNOSIS — M48062 Spinal stenosis, lumbar region with neurogenic claudication: Secondary | ICD-10-CM

## 2019-10-19 MED ORDER — LIDOCAINE HCL (PF) 1 % IJ SOLN
5.0000 mL | Freq: Once | INTRAMUSCULAR | Status: AC
  Administered 2019-10-19: 5 mL via INTRADERMAL

## 2019-10-19 MED ORDER — IOHEXOL 180 MG/ML  SOLN
20.0000 mL | Freq: Once | INTRAMUSCULAR | Status: AC | PRN
  Administered 2019-10-19: 15 mL via INTRATHECAL

## 2019-10-19 MED ORDER — OXYCODONE HCL 5 MG PO TABS: 5.0000 mg | ORAL_TABLET | ORAL | Status: DC | PRN

## 2019-10-19 MED ORDER — ONDANSETRON HCL 4 MG/2ML IJ SOLN: 4.0000 mg | Freq: Four times a day (QID) | INTRAMUSCULAR | Status: DC | PRN

## 2019-10-19 MED ORDER — OXYCODONE HCL 5 MG PO TABS
ORAL_TABLET | ORAL | Status: AC
  Filled 2019-10-19: qty 1

## 2019-10-19 MED ORDER — OXYCODONE HCL 5 MG PO TABS
5.0000 mg | ORAL_TABLET | ORAL | Status: DC | PRN
  Administered 2019-10-19: 5 mg via ORAL

## 2019-10-19 MED ORDER — DIAZEPAM 5 MG PO TABS
10.0000 mg | ORAL_TABLET | Freq: Once | ORAL | Status: AC
  Administered 2019-10-19: 10 mg via ORAL
  Filled 2019-10-19 (×2): qty 2

## 2019-10-19 MED ORDER — DIAZEPAM 5 MG PO TABS: 10.0000 mg | ORAL_TABLET | Freq: Once | ORAL | Status: DC

## 2019-10-19 NOTE — Discharge Instructions (Signed)
Myelogram and Lumbar Puncture Discharge Instructions  1. Go home and rest quietly for the next 24 hours.  It is important to lie flat for the next 24 hours.  Get up only to go to the restroom.  You may lie in the bed or on a couch on your back, your stomach, your left side or your right side.  You may have one pillow under your head.  You may have pillows between your knees while you are on your side or under your knees while you are on your back.  2. DO NOT drive today.  Recline the seat as far back as it will go, while still wearing your seat belt, on the way home.  3. You may get up to go to the bathroom as needed.  You may sit up for 10 minutes to eat.  You may resume your normal diet and medications unless otherwise indicated.  4. The incidence of headache, nausea, or vomiting is about 5% (one in 20 patients).  If you develop a headache, lie flat and drink plenty of fluids until the headache goes away.  Caffeinated beverages may be helpful.  If you develop severe nausea and vomiting or a headache that does not go away with flat bed rest, call Dr.Cabbell  5. You may resume normal activities after your 24 hours of bed rest is over; however, do not exert yourself strongly or do any heavy lifting tomorrow.  6. Call your physician for a follow-up appointment.  The results of your myelogram will be sent directly to your physician by the following day.   Discharge instructions have been explained to the patient.  The patient, or the person responsible for the patient, fully understands these instructions.

## 2019-10-19 NOTE — Op Note (Signed)
10/19/2019 lumbar Myelogram  PATIENT:  Stacy Villanueva is a 49 y.o. female  PRE-OPERATIVE DIAGNOSIS:  Neurogenic claudication due to spinal stenosis lumbar  POST-OPERATIVE DIAGNOSIS:  same  PROCEDURE:  Lumbar Myelogram  SURGEON:  Starlina Lapre  ANESTHESIA:   local LOCAL MEDICATIONS USED:  LIDOCAINE  Procedure Note: Stacy Villanueva is a 49 y.o. female Was taken to the fluoroscopy suite and  positioned prone on the fluoroscopy table. Her back was prepared and draped in a sterile manner. I infiltrated 5 cc into the lumbar region. I then introduced a spinal needle into the thecal sac at the L3/4 interlaminar space. I infiltrated 15cc of Isovue 180 into the thecal sac. Fluoroscopy showed the needle and contrast in the thecal sac. Stacy Villanueva tolerated the procedure well. she Will be taken to CT for evaluation.     PATIENT DISPOSITION:  Short Stay
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
## 2019-11-20 ENCOUNTER — Encounter (HOSPITAL_COMMUNITY): Payer: Self-pay | Admitting: Psychiatry

## 2019-11-20 ENCOUNTER — Telehealth (INDEPENDENT_AMBULATORY_CARE_PROVIDER_SITE_OTHER): Payer: 59 | Admitting: Psychiatry

## 2019-11-20 ENCOUNTER — Other Ambulatory Visit: Payer: Self-pay

## 2019-11-20 DIAGNOSIS — F331 Major depressive disorder, recurrent, moderate: Secondary | ICD-10-CM | POA: Diagnosis not present

## 2019-11-20 MED ORDER — ARIPIPRAZOLE 2 MG PO TABS: 2.0000 mg | ORAL_TABLET | Freq: Every day | ORAL | 0 refills | Status: DC

## 2019-11-20 NOTE — Patient Instructions (Signed)
1. Start Abilify 2 mg at night  2. Referral to therapy  3. Next appointment- 10/1 at 8:40

## 2019-12-08 ENCOUNTER — Other Ambulatory Visit (HOSPITAL_COMMUNITY): Payer: Self-pay | Admitting: Psychiatry

## 2019-12-08 ENCOUNTER — Ambulatory Visit (INDEPENDENT_AMBULATORY_CARE_PROVIDER_SITE_OTHER): Payer: 59 | Admitting: Clinical

## 2019-12-08 ENCOUNTER — Other Ambulatory Visit: Payer: Self-pay

## 2019-12-08 DIAGNOSIS — F331 Major depressive disorder, recurrent, moderate: Secondary | ICD-10-CM | POA: Diagnosis not present

## 2019-12-08 DIAGNOSIS — F4310 Post-traumatic stress disorder, unspecified: Secondary | ICD-10-CM | POA: Diagnosis not present

## 2019-12-08 DIAGNOSIS — F411 Generalized anxiety disorder: Secondary | ICD-10-CM

## 2019-12-08 MED ORDER — ARIPIPRAZOLE 2 MG PO TABS: 2.0000 mg | ORAL_TABLET | Freq: Every day | ORAL | 0 refills | Status: DC
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
## 2019-12-11 ENCOUNTER — Ambulatory Visit (HOSPITAL_COMMUNITY): Payer: 59 | Admitting: Clinical
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
## 2019-12-16 ENCOUNTER — Telehealth (HOSPITAL_COMMUNITY): Payer: Self-pay | Admitting: *Deleted

## 2019-12-16 NOTE — Telephone Encounter (Signed)
You can advise her to stop abilify until Dr. Modesta Messing returns to discuss

## 2019-12-16 NOTE — Telephone Encounter (Signed)
Patient called stating she is not doing well on the medication Abilify. Per pt she has gained 10lbs in 1 week, not sleeping when she takes it later in the say and when she took in the day time, it made her sleeping. Per pt the Abilify is also making her extremely fatigue and giving her severe headaches and causing her to be very hungry. Per pt she is waking up in the middle of the night to get food to eat although she is not hungry but have this feeling like she have to eat something.   Patient then stated she stated the therapy session and she was not impressed with it at all. But she would like to talk with Dr. Modesta Messing about her Abilify and see what they can figure out.

## 2019-12-17 NOTE — Telephone Encounter (Signed)
Informed patient with what provider stated and she verbalized understanding.  

## 2019-12-18 ENCOUNTER — Telehealth (INDEPENDENT_AMBULATORY_CARE_PROVIDER_SITE_OTHER): Payer: 59 | Admitting: Psychiatry

## 2019-12-18 ENCOUNTER — Encounter (HOSPITAL_COMMUNITY): Payer: Self-pay | Admitting: Psychiatry

## 2019-12-18 ENCOUNTER — Other Ambulatory Visit: Payer: Self-pay

## 2019-12-18 DIAGNOSIS — F331 Major depressive disorder, recurrent, moderate: Secondary | ICD-10-CM | POA: Diagnosis not present

## 2019-12-18 MED ORDER — LURASIDONE HCL 20 MG PO TABS: 20.0000 mg | ORAL_TABLET | Freq: Every day | ORAL | 1 refills | Status: AC

## 2019-12-23 ENCOUNTER — Ambulatory Visit (HOSPITAL_COMMUNITY): Payer: 59 | Admitting: Clinical

## 2019-12-28 ENCOUNTER — Other Ambulatory Visit (HOSPITAL_COMMUNITY): Payer: Self-pay | Admitting: Psychiatry

## 2021-01-25 ENCOUNTER — Encounter: Payer: Managed Care, Other (non HMO) | Admitting: Vascular Surgery

## 2021-03-03 IMAGING — RF DG C-ARM 1-60 MIN
1 series · 4 of 4 positions shown · non-contrast
Comparison: MRI 09/29/2018, 04/16/2019

CLINICAL DATA: L4-5 posterior lumbar interbody fusion

EXAM:
LUMBAR SPINE - 2-3 VIEW; DG C-ARM 1-60 MIN

[Series 1: run · 4 of 4 slices shown]
[im 1/4]
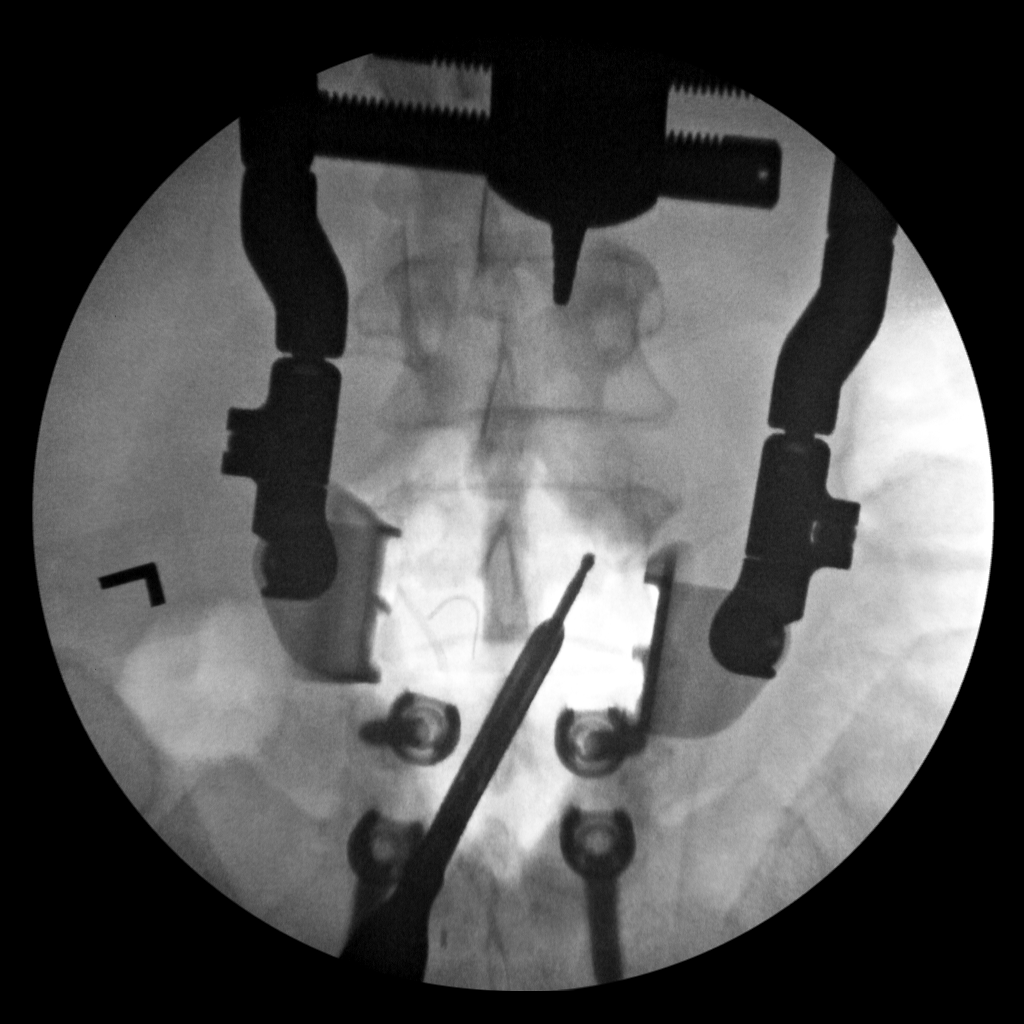
[im 2/4]
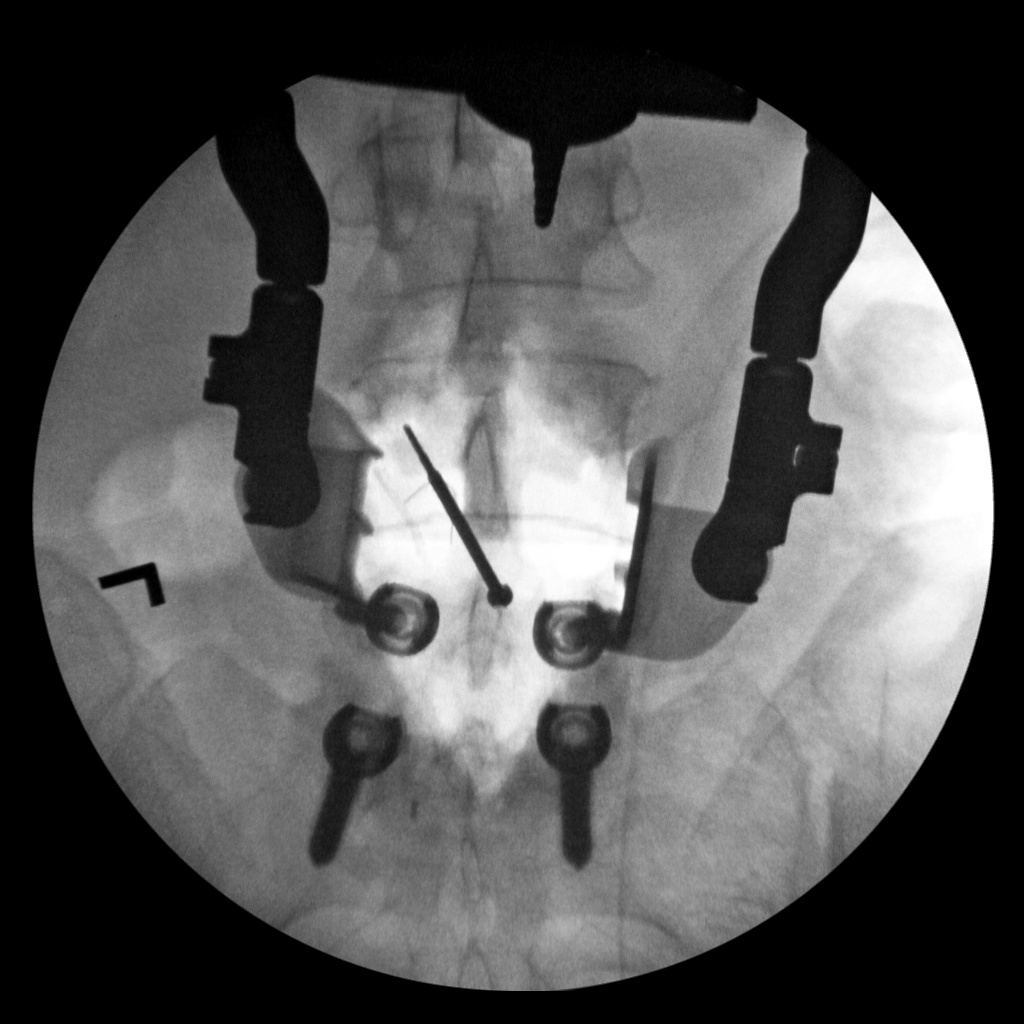
[im 3/4]
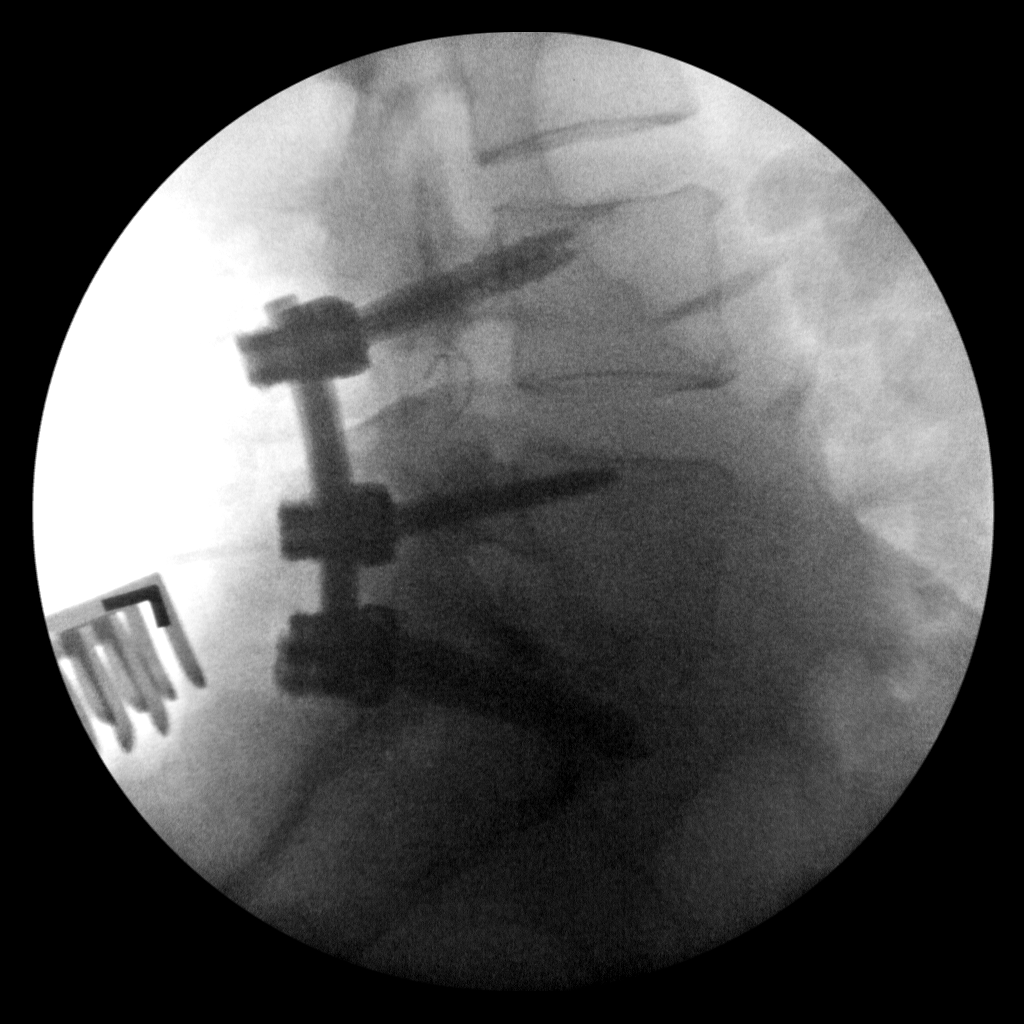
[im 4/4]
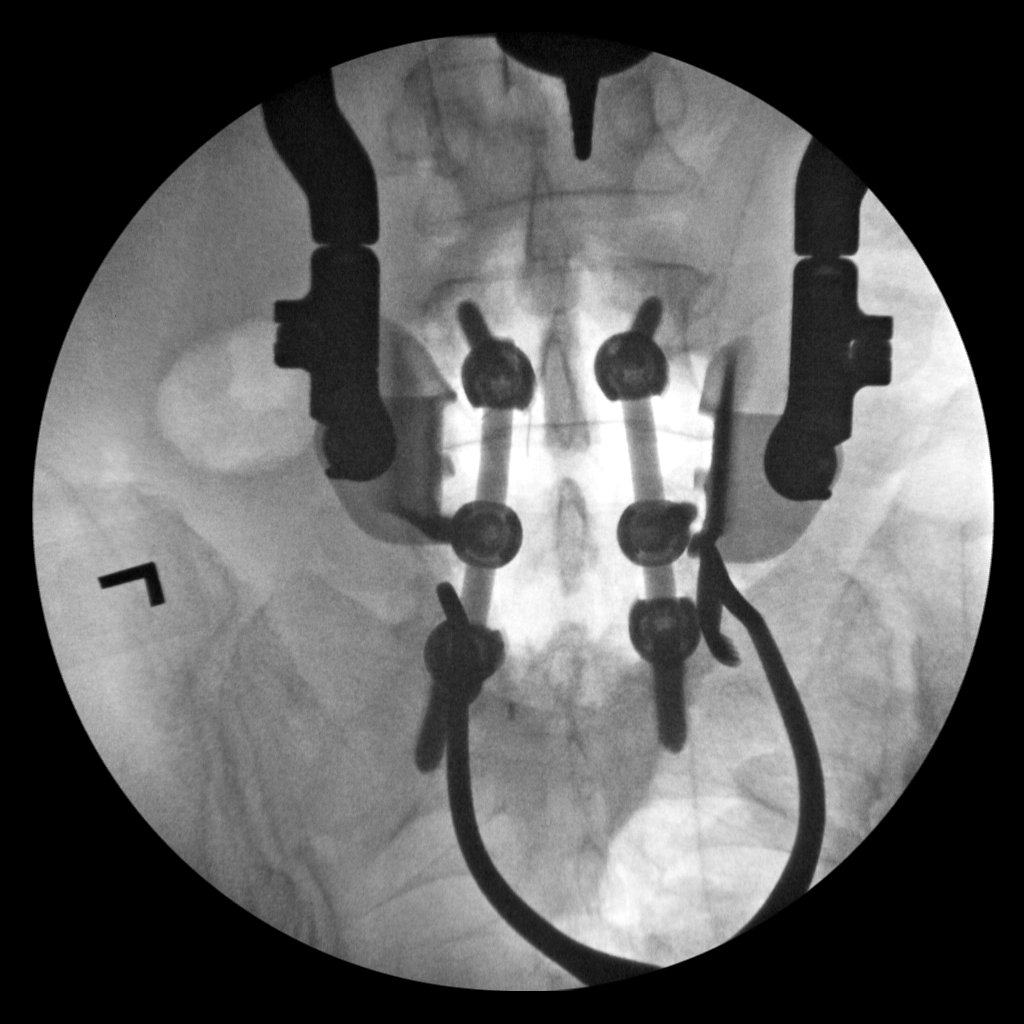

[4 of 4 positions shown; findings below may reference images not displayed]

FINDINGS: Four low resolution intraoperative spot views of the lumbar spine.
Total fluoroscopy time was 29 seconds.the images demonstrate
surgical instruments over the lumbar spine. Prior posterior rods and
screws at L5-S1. Subsequent placement of posterior rods and fixating
screws at L4-L5
IMPRESSION: Intraoperative fluoroscopic assistance provided during lumbar spine
surgery.

## 2021-04-18 ENCOUNTER — Other Ambulatory Visit (HOSPITAL_COMMUNITY): Payer: Self-pay

## 2021-04-18 MED ORDER — BUPROPION HCL ER (XL) 150 MG PO TB24
ORAL_TABLET | ORAL | 0 refills | Status: DC
  Filled 2021-04-18: qty 30, 30d supply, fill #0

## 2021-04-19 ENCOUNTER — Other Ambulatory Visit (HOSPITAL_COMMUNITY): Payer: Self-pay

## 2021-04-19 MED ORDER — CYCLOBENZAPRINE HCL 10 MG PO TABS
ORAL_TABLET | ORAL | 0 refills | Status: AC
  Filled 2021-04-19: qty 270, 90d supply, fill #0

## 2021-04-19 MED ORDER — GABAPENTIN 300 MG PO CAPS
ORAL_CAPSULE | ORAL | 0 refills | Status: DC
  Filled 2021-04-19: qty 60, 30d supply, fill #0

## 2021-04-19 MED ORDER — LEVOTHYROXINE SODIUM 100 MCG PO TABS
ORAL_TABLET | ORAL | 0 refills | Status: AC
  Filled 2021-04-19: qty 90, 90d supply, fill #0

## 2021-04-20 ENCOUNTER — Other Ambulatory Visit (HOSPITAL_COMMUNITY): Payer: Self-pay

## 2021-05-14 ENCOUNTER — Other Ambulatory Visit (HOSPITAL_COMMUNITY): Payer: Self-pay

## 2021-05-15 ENCOUNTER — Other Ambulatory Visit (HOSPITAL_COMMUNITY): Payer: Self-pay

## 2021-05-15 MED ORDER — GABAPENTIN 300 MG PO CAPS
ORAL_CAPSULE | ORAL | 5 refills | Status: DC
  Filled 2021-05-15: qty 60, 30d supply, fill #0
  Filled 2021-06-12: qty 60, 30d supply, fill #1

## 2021-05-19 ENCOUNTER — Other Ambulatory Visit (HOSPITAL_COMMUNITY): Payer: Self-pay

## 2021-05-19 MED ORDER — CITALOPRAM HYDROBROMIDE 10 MG PO TABS
ORAL_TABLET | ORAL | 0 refills | Status: AC
  Filled 2021-05-19: qty 30, 30d supply, fill #0

## 2021-06-06 ENCOUNTER — Other Ambulatory Visit (HOSPITAL_COMMUNITY): Payer: Self-pay

## 2021-06-06 MED ORDER — ESTRADIOL 1 MG PO TABS
ORAL_TABLET | ORAL | 4 refills | Status: AC
  Filled 2021-06-06: qty 90, 90d supply, fill #0

## 2021-06-07 ENCOUNTER — Other Ambulatory Visit (HOSPITAL_COMMUNITY): Payer: Self-pay

## 2021-06-07 MED ORDER — OXYCODONE HCL 5 MG PO TABS
ORAL_TABLET | ORAL | 0 refills | Status: DC
  Filled 2021-06-07: qty 56, 14d supply, fill #0

## 2021-06-12 ENCOUNTER — Other Ambulatory Visit (HOSPITAL_COMMUNITY): Payer: Self-pay

## 2021-07-07 ENCOUNTER — Other Ambulatory Visit (HOSPITAL_COMMUNITY): Payer: Self-pay

## 2021-07-07 MED ORDER — GABAPENTIN 300 MG PO CAPS
ORAL_CAPSULE | ORAL | 0 refills | Status: AC
  Filled 2021-07-07: qty 90, 30d supply, fill #0

## 2021-08-16 ENCOUNTER — Other Ambulatory Visit (HOSPITAL_COMMUNITY): Payer: Self-pay

## 2021-08-17 ENCOUNTER — Other Ambulatory Visit (HOSPITAL_COMMUNITY): Payer: Self-pay

## 2021-08-17 MED ORDER — OXYCODONE HCL 5 MG PO TABS
5.0000 mg | ORAL_TABLET | Freq: Four times a day (QID) | ORAL | 0 refills | Status: AC | PRN
  Filled 2021-08-17: qty 56, 14d supply, fill #0

## 2023-09-19 ENCOUNTER — Encounter: Payer: Self-pay | Admitting: Gastroenterology

## 2023-10-18 ENCOUNTER — Other Ambulatory Visit: Payer: Self-pay | Admitting: Medical Genetics

## 2024-01-15 ENCOUNTER — Other Ambulatory Visit: Payer: Self-pay | Admitting: Medical Genetics

## 2024-01-15 DIAGNOSIS — Z006 Encounter for examination for normal comparison and control in clinical research program: Secondary | ICD-10-CM

## 2024-02-25 LAB — GENECONNECT MOLECULAR SCREEN: Genetic Analysis Overall Interpretation: NEGATIVE
